# Patient Record
Sex: Female | Born: 2000 | Race: White | Hispanic: No | Marital: Single | Smoking: Never smoker
Health system: Southern US, Community
[De-identification: ages and names within clinical notes are randomized; demographics above are authoritative.]

## PROBLEM LIST (undated history)

## (undated) DIAGNOSIS — D6869 Other thrombophilia: Secondary | ICD-10-CM

## (undated) DIAGNOSIS — N911 Secondary amenorrhea: Secondary | ICD-10-CM

## (undated) DIAGNOSIS — E782 Mixed hyperlipidemia: Secondary | ICD-10-CM

## (undated) DIAGNOSIS — K219 Gastro-esophageal reflux disease without esophagitis: Secondary | ICD-10-CM

## (undated) DIAGNOSIS — E079 Disorder of thyroid, unspecified: Secondary | ICD-10-CM

## (undated) HISTORY — DX: Mixed hyperlipidemia: E78.2

## (undated) HISTORY — PX: TONSILECTOMY, ADENOIDECTOMY, BILATERAL MYRINGOTOMY AND TUBES: SHX2538

## (undated) HISTORY — DX: Other thrombophilia: D68.69

## (undated) HISTORY — DX: Disorder of thyroid, unspecified: E07.9

## (undated) HISTORY — DX: Secondary amenorrhea: N91.1

## (undated) HISTORY — DX: Gastro-esophageal reflux disease without esophagitis: K21.9

---
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
## 2019-05-05 ENCOUNTER — Ambulatory Visit (INDEPENDENT_AMBULATORY_CARE_PROVIDER_SITE_OTHER): Payer: No Typology Code available for payment source | Admitting: Legal Medicine

## 2019-05-05 ENCOUNTER — Encounter: Payer: Self-pay | Admitting: Legal Medicine

## 2019-05-05 ENCOUNTER — Other Ambulatory Visit: Payer: Self-pay

## 2019-05-05 VITALS — BP 110/80 | HR 78 | Temp 97.6°F | Resp 17 | Ht 65.0 in | Wt 259.4 lb

## 2019-05-05 DIAGNOSIS — N978 Female infertility of other origin: Secondary | ICD-10-CM | POA: Diagnosis not present

## 2019-05-05 DIAGNOSIS — J301 Allergic rhinitis due to pollen: Secondary | ICD-10-CM | POA: Diagnosis not present

## 2019-05-05 DIAGNOSIS — E039 Hypothyroidism, unspecified: Secondary | ICD-10-CM | POA: Diagnosis not present

## 2019-05-05 DIAGNOSIS — E782 Mixed hyperlipidemia: Secondary | ICD-10-CM | POA: Diagnosis not present

## 2019-05-05 DIAGNOSIS — K2100 Gastro-esophageal reflux disease with esophagitis, without bleeding: Secondary | ICD-10-CM

## 2019-05-05 DIAGNOSIS — G43C0 Periodic headache syndromes in child or adult, not intractable: Secondary | ICD-10-CM

## 2019-05-05 MED ORDER — FEXOFENADINE HCL 180 MG PO TABS: 180.0000 mg | ORAL_TABLET | Freq: Every day | ORAL | 3 refills | Status: DC

## 2019-05-05 MED ORDER — LEVOTHYROXINE SODIUM 50 MCG PO TABS: 50.0000 ug | ORAL_TABLET | Freq: Every day | ORAL | 6 refills | Status: DC

## 2019-05-05 MED ORDER — MEDROXYPROGESTERONE ACETATE 5 MG PO TABS: 5.0000 mg | ORAL_TABLET | Freq: Every day | ORAL | 6 refills | Status: DC

## 2019-05-05 MED ORDER — OMEPRAZOLE 20 MG PO CPDR: 20.0000 mg | DELAYED_RELEASE_CAPSULE | Freq: Two times a day (BID) | ORAL | 6 refills | Status: AC

## 2019-05-05 MED ORDER — IBUPROFEN 800 MG PO TABS: 800.0000 mg | ORAL_TABLET | Freq: Three times a day (TID) | ORAL | 6 refills | Status: DC | PRN

## 2019-05-05 NOTE — Assessment & Plan Note (Signed)
AN INDIVIDUAL CARE PLAN was established and reinforced today.  The patient's status was assessed using clinical findings on exam, lab and other diagnostic tests. The patient's disease status was assessed based on evidence-based guidelines and found to be good lipid controlled. MEDICATIONS were reviewed. SELF MANAGEMENT GOALS have been discussed and patient's success at attaining the goal of low cholesterol was assessed. RECOMMENDATION given include regular exercise 3 days a week and low cholesterol/low fat diet. CLINICAL SUMMARY including written plan to identify barriers unique to the patient due to social or economic  reasons was discussed.

## 2019-05-05 NOTE — Assessment & Plan Note (Signed)
We discussed allergies and she is to take her allergy medicine regularly.

## 2019-05-05 NOTE — Progress Notes (Signed)
Established Patient Office Visit  Subjective:  Patient ID: Kelli Gibson, female    DOB: 12-08-2000  Age: 19 y.o. MRN: [id number]  CC:  Chief Complaint  Patient presents with  . Hypothyroidism  . Gastroesophageal Reflux    HPI Kelli Gibson presents for Chronic visit  Patient has HYPOTHYROIDISM.  Diagnosed 6 years ago.  Patient has stable thyroid readings.  Patient is having no symptoms.  Last TSH was 3.8.  continue dosage of thyroid medicine.  Patient has gastroesophageal reflux symptoms withesophagitis and LTRD.  The symptoms are mid intensity.  Length of symptoms 6 years.  Medicines include omeprazole.  Complications include none  Patient presents with hyperlipidemia.  Compliance with treatment has been good; patient takes medicines as directed, maintains low cholesterol diet, follows up as directed, and maintains exercise regimen.  Patient is using diet without problems..  Past Medical History:  Diagnosis Date  . GERD (gastroesophageal reflux disease)   . Hyperlipemia, mixed   . Other thrombophilia ([place])   . Secondary amenorrhea   . Thyroid disease     Past Surgical History:  Procedure Laterality Date  . TONSILECTOMY, ADENOIDECTOMY, BILATERAL MYRINGOTOMY AND TUBES  4  . TYMPANOPLASTY Left 2014    Family History  Problem Relation Age of Onset  . Hypothyroidism Mother   . Hyperlipidemia Mother   . Thrombosis Mother   . Hypertension Father     Social History   Socioeconomic History  . Marital status: Single    Spouse name: Not on file  . Number of children: 0  . Years of education: Not on file  . Highest education level: High school graduate  Occupational History  . Occupation: kitchen at [employer]  Tobacco Use  . Smoking status: Never Smoker  . Smokeless tobacco: Never Used  Substance and Sexual Activity  . Alcohol use: Not Currently    Alcohol/week: 0.0 standard drinks  . Drug use: Never  . Sexual activity: Not Currently  Other Topics  Concern  . Not on file  Social History Narrative  . Not on file   Social Determinants of Health   Financial Resource Strain:   . Difficulty of Paying Living Expenses:   Food Insecurity:   . Worried About Charity fundraiser in the Last Year:   . Arboriculturist in the Last Year:   Transportation Needs:   . Film/video editor (Medical):   Marland Kitchen Lack of Transportation (Non-Medical):   Physical Activity:   . Days of Exercise per Week:   . Minutes of Exercise per Session:   Stress:   . Feeling of Stress :   Social Connections:   . Frequency of Communication with Friends and Family:   . Frequency of Social Gatherings with Friends and Family:   . Attends Religious Services:   . Active Member of Clubs or Organizations:   . Attends Archivist Meetings:   Marland Kitchen Marital Status:   Intimate Partner Violence:   . Fear of Current or Ex-Partner:   . Emotionally Abused:   Marland Kitchen Physically Abused:   . Sexually Abused:     Outpatient Medications Prior to Visit  Medication Sig Dispense Refill  . mometasone (NASONEX) 50 MCG/ACT nasal spray Place into the nose.    . Multiple Vitamin (MULTIVITAMIN) capsule Take by mouth.    . fexofenadine (ALLEGRA) 180 MG tablet Take 180 mg by mouth daily.    . fluticasone (FLONASE) 50 MCG/ACT nasal spray Place into the nose.    Marland Kitchen  ibuprofen (ADVIL) 800 MG tablet Take 800 mg by mouth every 8 (eight) hours as needed.    Marland Kitchen levothyroxine (SYNTHROID) 50 MCG tablet Take by mouth.    . medroxyPROGESTERone (PROVERA) 5 MG tablet Take by mouth.    . Omega-3 Fatty Acids (FISH OIL) 1000 MG CAPS Take by mouth.    Marland Kitchen omeprazole (PRILOSEC) 20 MG capsule Take 20 mg by mouth 2 (two) times daily before a meal.    . montelukast (SINGULAIR) 10 MG tablet     . loratadine (CLARITIN) 10 MG tablet     . propranolol (INDERAL) 20 MG tablet Take by mouth.     No facility-administered medications prior to visit.    No Known Allergies  ROS Review of Systems  Constitutional:  Negative.   HENT: Negative.   Eyes: Negative.        Glasses  Respiratory: Negative.   Cardiovascular: Negative.   Gastrointestinal: Negative.   Endocrine: Negative.   Genitourinary: Negative.   Musculoskeletal: Negative.   Neurological: Negative.       Objective:    Physical Exam  Constitutional: She is oriented to person, place, and time. She appears well-developed and well-nourished.  HENT:  Head: Atraumatic.  Eyes: Pupils are equal, round, and reactive to light. Conjunctivae and EOM are normal.  Cardiovascular: Normal rate, regular rhythm and normal heart sounds.  Pulmonary/Chest: Effort normal.  Abdominal: Soft. Bowel sounds are normal.  Musculoskeletal:        General: Normal range of motion.     Cervical back: Normal range of motion and neck supple.  Neurological: She is alert and oriented to person, place, and time. She has normal reflexes.  Skin: Skin is warm.  Psychiatric: She has a normal mood and affect.  Vitals reviewed.   BP 110/80 (BP Location: Right Arm, Patient Position: Sitting)   Pulse 78   Temp 97.6 F (36.4 C) (Temporal)   Resp 17   Ht 5' 5"  (1.651 m)   Wt 259 lb 6.4 oz (117.7 kg)   BMI 43.17 kg/m  Wt Readings from Last 3 Encounters:  05/05/19 259 lb 6.4 oz (117.7 kg) (>99 %, Z= 2.57)*   * Growth percentiles are based on CDC (Girls, 2-20 Years) data.     Health Maintenance Due  Topic Date Due  . HIV Screening  Never done  . INFLUENZA VACCINE  Never done  . TETANUS/TDAP  Never done    There are no preventive care reminders to display for this patient.  No results found for: TSH No results found for: WBC, HGB, HCT, MCV, PLT No results found for: NA, K, CHLORIDE, CO2, GLUCOSE, BUN, CREATININE, BILITOT, ALKPHOS, AST, ALT, PROT, ALBUMIN, CALCIUM, ANIONGAP, EGFR, GFR No results found for: CHOL No results found for: HDL No results found for: LDLCALC No results found for: TRIG No results found for: CHOLHDL No results found for: HGBA1C     Assessment & Plan:   Problem List Items Addressed This Visit      Respiratory   Allergic rhinitis - Primary    We discussed allergies and she is to take her allergy medicine regularly.      Relevant Medications   fexofenadine (ALLEGRA) 180 MG tablet     Endocrine   Adult hypothyroidism    AN INDIVIDUAL CARE PLAN was established and reinforced today.  The patient's status was assessed using clinical findings on exam, labs, and other diagnostic testing. Patient's success at meeting treatment goals based on disease specific evidence-bassed guidelines  and found to be in good control. RECOMMENDATIONS include maintaining present medicines and treatment.      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  She is doing well with no intermittent bleeding.      Relevant Medications   medroxyPROGESTERone (PROVERA) 5 MG tablet     Other   Mixed hyperlipidemia    AN INDIVIDUAL CARE PLAN was established and reinforced today.  The patient's status was assessed using clinical findings on exam, lab and other diagnostic tests. The patient's disease status was assessed based on evidence-based guidelines and found to be good lipid controlled. MEDICATIONS were reviewed. SELF MANAGEMENT GOALS have been discussed and patient's success at attaining the goal of low cholesterol was assessed. RECOMMENDATION given include regular exercise 3 days a week and low cholesterol/low fat diet. CLINICAL SUMMARY including written plan to identify barriers unique to the patient due to social or economic  reasons was discussed.      Relevant Orders   Lipid Panel    Other Visit Diagnoses    Gastroesophageal reflux disease with esophagitis without hemorrhage       Relevant Medications   omeprazole (PRILOSEC) 20 MG capsule    Periodic headache syndrome, not intractable       Relevant Medications   ibuprofen (ADVIL) 800 MG tablet      Meds ordered this encounter  Medications  . fexofenadine (ALLEGRA) 180 MG tablet    Sig: Take 1 tablet (180 mg total) by mouth daily.    Dispense:  30 tablet    Refill:  3  . medroxyPROGESTERone (PROVERA) 5 MG tablet    Sig: Take 1 tablet (5 mg total) by mouth daily.    Dispense:  30 tablet    Refill:  6  . omeprazole (PRILOSEC) 20 MG capsule    Sig: Take 1 capsule (20 mg total) by mouth 2 (two) times daily before a meal.    Dispense:  60 capsule    Refill:  6  . levothyroxine (SYNTHROID) 50 MCG tablet    Sig: Take 1 tablet (50 mcg total) by mouth daily before breakfast.    Dispense:  30 tablet    Refill:  6  . ibuprofen (ADVIL) 800 MG tablet    Sig: Take 1 tablet (800 mg total) by mouth every 8 (eight) hours as needed for headache.    Dispense:  30 tablet    Refill:  6    Follow-up: Return in about 6 months (around 11/05/2019) for fasting.    Reinaldo Meeker, MD

## 2019-05-05 NOTE — Patient Instructions (Signed)

## 2019-05-05 NOTE — Assessment & Plan Note (Signed)
Continue on provera that GYN started.  She is doing well with no intermittent bleeding.

## 2019-05-05 NOTE — Assessment & Plan Note (Signed)
AN INDIVIDUAL CARE PLAN was established and reinforced today.  The patient's status was assessed using clinical findings on exam, labs, and other diagnostic testing. Patient's success at meeting treatment goals based on disease specific evidence-bassed guidelines and found to be in good control. RECOMMENDATIONS include maintaining present medicines and treatment. 

## 2019-05-06 LAB — COMPREHENSIVE METABOLIC PANEL
ALT: 10 IU/L (ref 0–32)
AST: 12 IU/L (ref 0–40)
Albumin/Globulin Ratio: 1.7 (ref 1.2–2.2)
Albumin: 4.3 g/dL (ref 3.9–5.0)
Alkaline Phosphatase: 101 IU/L (ref 39–117)
BUN/Creatinine Ratio: 19 (ref 9–23)
BUN: 13 mg/dL (ref 6–20)
Bilirubin Total: <0.2 mg/dL (ref 0.0–1.2)
CO2: 20 mmol/L (ref 20–29)
Calcium: 9.4 mg/dL (ref 8.7–10.2)
Chloride: 104 mmol/L (ref 96–106)
Creatinine, Ser: 0.7 mg/dL (ref 0.57–1.00)
GFR calc Af Amer: 145 mL/min/{1.73_m2} (ref >59)
GFR calc non Af Amer: 126 mL/min/{1.73_m2} (ref >59)
Globulin, Total: 2.5 g/dL (ref 1.5–4.5)
Glucose: 81 mg/dL (ref 65–99)
Potassium: 4.7 mmol/L (ref 3.5–5.2)
Sodium: 140 mmol/L (ref 134–144)
Total Protein: 6.8 g/dL (ref 6.0–8.5)

## 2019-05-06 LAB — CBC WITH DIFFERENTIAL/PLATELET
Basophils Absolute: 0 10*3/uL (ref 0.0–0.2)
Basos: 0 %
EOS (ABSOLUTE): 0.2 10*3/uL (ref 0.0–0.4)
Eos: 2 %
Hematocrit: 39.7 % (ref 34.0–46.6)
Hemoglobin: 12.4 g/dL (ref 11.1–15.9)
Immature Grans (Abs): 0 10*3/uL (ref 0.0–0.1)
Immature Granulocytes: 0 %
Lymphocytes Absolute: 2.5 10*3/uL (ref 0.7–3.1)
Lymphs: 28 %
MCH: 24.9 pg — ABNORMAL LOW (ref 26.6–33.0)
MCHC: 31.2 g/dL — ABNORMAL LOW (ref 31.5–35.7)
MCV: 80 fL (ref 79–97)
Monocytes Absolute: 0.6 10*3/uL (ref 0.1–0.9)
Monocytes: 7 %
Neutrophils Absolute: 5.6 10*3/uL (ref 1.4–7.0)
Neutrophils: 63 %
Platelets: 421 10*3/uL (ref 150–450)
RBC: 4.98 x10E6/uL (ref 3.77–5.28)
RDW: 14.9 % (ref 11.7–15.4)
WBC: 9 10*3/uL (ref 3.4–10.8)

## 2019-05-06 LAB — LIPID PANEL
Chol/HDL Ratio: 4.8 ratio — ABNORMAL HIGH (ref 0.0–4.4)
Cholesterol, Total: 205 mg/dL — ABNORMAL HIGH (ref 100–169)
HDL: 43 mg/dL (ref >39)
LDL Chol Calc (NIH): 137 mg/dL — ABNORMAL HIGH (ref 0–109)
Triglycerides: 140 mg/dL — ABNORMAL HIGH (ref 0–89)
VLDL Cholesterol Cal: 25 mg/dL (ref 5–40)

## 2019-05-06 LAB — TSH: TSH: 6.6 u[IU]/mL — ABNORMAL HIGH (ref 0.450–4.500)

## 2019-05-06 LAB — CARDIOVASCULAR RISK ASSESSMENT

## 2019-05-07 ENCOUNTER — Other Ambulatory Visit: Payer: Self-pay | Admitting: Legal Medicine

## 2019-05-07 DIAGNOSIS — E039 Hypothyroidism, unspecified: Secondary | ICD-10-CM

## 2019-05-07 MED ORDER — LEVOTHYROXINE SODIUM 75 MCG PO TABS: 75.0000 ug | ORAL_TABLET | Freq: Every day | ORAL | 1 refills | Status: DC

## 2019-05-07 NOTE — Progress Notes (Signed)
Cbc normal. Kidney and liver tests normal, Cholesterol is vey high LDL 137 need to start low cholesterol diet, TSH 6.60- need to increase thyroid to a day. lp

## 2019-05-09 ENCOUNTER — Other Ambulatory Visit: Payer: Self-pay | Admitting: Legal Medicine

## 2019-05-09 DIAGNOSIS — E782 Mixed hyperlipidemia: Secondary | ICD-10-CM

## 2019-05-09 MED ORDER — PRAVASTATIN SODIUM 40 MG PO TABS: 40.0000 mg | ORAL_TABLET | Freq: Every day | ORAL | 1 refills | Status: DC

## 2019-07-29 ENCOUNTER — Encounter: Payer: Self-pay | Admitting: Legal Medicine

## 2019-07-29 ENCOUNTER — Ambulatory Visit (INDEPENDENT_AMBULATORY_CARE_PROVIDER_SITE_OTHER): Payer: Self-pay | Admitting: Legal Medicine

## 2019-07-29 ENCOUNTER — Other Ambulatory Visit: Payer: Self-pay

## 2019-07-29 VITALS — BP 120/84 | HR 91 | Temp 98.0°F | Resp 16 | Ht 65.0 in | Wt 258.0 lb

## 2019-07-29 DIAGNOSIS — S39012A Strain of muscle, fascia and tendon of lower back, initial encounter: Secondary | ICD-10-CM

## 2019-07-29 MED ORDER — CYCLOBENZAPRINE HCL 10 MG PO TABS: 10.0000 mg | ORAL_TABLET | Freq: Three times a day (TID) | ORAL | 0 refills | Status: AC | PRN

## 2019-07-29 MED ORDER — TRAMADOL HCL 50 MG PO TABS: 50.0000 mg | ORAL_TABLET | Freq: Three times a day (TID) | ORAL | 0 refills | Status: AC | PRN

## 2019-07-29 NOTE — Progress Notes (Signed)
Established Patient Office Visit  Subjective:  Patient ID: Kelli Gibson, female    DOB: Jun 10, 2000  Age: 19 y.o. MRN: [id number]  CC:  Chief Complaint  Patient presents with  . Back Pain    HPI MAME TWOMBLY presents for low back pain for 3 weeks, getting- lifting left leg.   Better Standing still.  Worse pain lying down.  Past Medical History:  Diagnosis Date  . GERD (gastroesophageal reflux disease)   . Hyperlipemia, mixed   . Other thrombophilia ([place])   . Secondary amenorrhea   . Thyroid disease     Past Surgical History:  Procedure Laterality Date  . TONSILECTOMY, ADENOIDECTOMY, BILATERAL MYRINGOTOMY AND TUBES  4  . TYMPANOPLASTY Left 2014    Family History  Problem Relation Age of Onset  . Hypothyroidism Mother   . Hyperlipidemia Mother   . Thrombosis Mother   . Hypertension Father     Social History   Socioeconomic History  . Marital status: Single    Spouse name: Not on file  . Number of children: 0  . Years of education: Not on file  . Highest education level: High school graduate  Occupational History  . Occupation: kitchen at [employer]  Tobacco Use  . Smoking status: Never Smoker  . Smokeless tobacco: Never Used  Substance and Sexual Activity  . Alcohol use: Not Currently    Alcohol/week: 0.0 standard drinks  . Drug use: Never  . Sexual activity: Not Currently  Other Topics Concern  . Not on file  Social History Narrative  . Not on file   Social Determinants of Health   Financial Resource Strain:   . Difficulty of Paying Living Expenses:   Food Insecurity:   . Worried About Charity fundraiser in the Last Year:   . Arboriculturist in the Last Year:   Transportation Needs:   . Film/video editor (Medical):   Marland Kitchen Lack of Transportation (Non-Medical):   Physical Activity:   . Days of Exercise per Week:   . Minutes of Exercise per Session:   Stress:   . Feeling of Stress :   Social Connections:   . Frequency of  Communication with Friends and Family:   . Frequency of Social Gatherings with Friends and Family:   . Attends Religious Services:   . Active Member of Clubs or Organizations:   . Attends Archivist Meetings:   Marland Kitchen Marital Status:   Intimate Partner Violence:   . Fear of Current or Ex-Partner:   . Emotionally Abused:   Marland Kitchen Physically Abused:   . Sexually Abused:     Outpatient Medications Prior to Visit  Medication Sig Dispense Refill  . fexofenadine (ALLEGRA) 180 MG tablet Take 1 tablet (180 mg total) by mouth daily. 30 tablet 3  . ibuprofen (ADVIL) 800 MG tablet Take 1 tablet (800 mg total) by mouth every 8 (eight) hours as needed for headache. 30 tablet 6  . levothyroxine (SYNTHROID) 75 MCG tablet Take 1 tablet (75 mcg total) by mouth daily. 90 tablet 1  . medroxyPROGESTERone (PROVERA) 5 MG tablet Take 1 tablet (5 mg total) by mouth daily. 30 tablet 6  . mometasone (NASONEX) 50 MCG/ACT nasal spray Place into the nose.    . montelukast (SINGULAIR) 10 MG tablet     . Multiple Vitamin (MULTIVITAMIN) capsule Take by mouth.    Marland Kitchen omeprazole (PRILOSEC) 20 MG capsule Take 1 capsule (20 mg total) by mouth 2 (  two) times daily before a meal. 60 capsule 6  . pravastatin (PRAVACHOL) 40 MG tablet Take 1 tablet (40 mg total) by mouth daily. 90 tablet 1   No facility-administered medications prior to visit.    Allergies  Allergen Reactions  . Other Other (See Comments)    Tree nuts    ROS Review of Systems  Constitutional: Negative.   HENT: Negative.   Cardiovascular: Negative.   Gastrointestinal: Negative.   Genitourinary: Negative.   Musculoskeletal: Positive for back pain.  Skin: Negative.   Psychiatric/Behavioral: Negative.       Objective:    Physical Exam  Constitutional: She appears well-developed and well-nourished.  Cardiovascular: Normal rate, regular rhythm, normal heart sounds and intact distal pulses.  Pulmonary/Chest: Effort normal and breath sounds normal.    Musculoskeletal:     Lumbar back: Pain, spasms and tenderness present.       Back:     Comments: Flexion 20 degrees , extension 10 degrees with pain, Negative SLR bilaterally, no motor or sensory deficits in LE.  Vitals reviewed.   BP 120/84   Pulse 91   Temp 98 F (36.7 C)   Resp 16   Ht 5\' 5"  (1.651 m)   Wt 258 lb (117 kg)   SpO2 96%   BMI 42.93 kg/m  Wt Readings from Last 3 Encounters:  07/29/19 258 lb (117 kg) (>99 %, Z= 2.57)*  05/05/19 259 lb 6.4 oz (117.7 kg) (>99 %, Z= 2.57)*   * Growth percentiles are based on CDC (Girls, 2-20 Years) data.     Health Maintenance Due  Topic Date Due  . COVID-19 Vaccine (1) Never done  . HIV Screening  Never done  . TETANUS/TDAP  Never done    There are no preventive care reminders to display for this patient.  Lab Results  Component Value Date   TSH 6.600 (H) 05/05/2019   Lab Results  Component Value Date   WBC 9.0 05/05/2019   HGB 12.4 05/05/2019   HCT 39.7 05/05/2019   MCV 80 05/05/2019   PLT 421 05/05/2019   Lab Results  Component Value Date   NA 140 05/05/2019   K 4.7 05/05/2019   CO2 20 05/05/2019   GLUCOSE 81 05/05/2019   BUN 13 05/05/2019   CREATININE 0.70 05/05/2019   BILITOT <0.2 05/05/2019   ALKPHOS 101 05/05/2019   AST 12 05/05/2019   ALT 10 05/05/2019   PROT 6.8 05/05/2019   ALBUMIN 4.3 05/05/2019   CALCIUM 9.4 05/05/2019   Lab Results  Component Value Date   CHOL 205 (H) 05/05/2019   Lab Results  Component Value Date   HDL 43 05/05/2019   Lab Results  Component Value Date   LDLCALC 137 (H) 05/05/2019   Lab Results  Component Value Date   TRIG 140 (H) 05/05/2019   Lab Results  Component Value Date   CHOLHDL 4.8 (H) 05/05/2019   No results found for: HGBA1C    Assessment & Plan:   Lumbar back sprain: We discussed treatment.  Add flexeril and tramadol for pain.  Home exercises given  Note for work this weekend.  Meds ordered this encounter  Medications  . cyclobenzaprine  (FLEXERIL) 10 MG tablet    Sig: Take 1 tablet (10 mg total) by mouth 3 (three) times daily as needed for muscle spasms.    Dispense:  30 tablet    Refill:  0  . traMADol (ULTRAM) 50 MG tablet    Sig: Take 1  tablet (50 mg total) by mouth every 8 (eight) hours as needed for up to 10 days.    Dispense:  30 tablet    Refill:  0    Follow-up: Return in about 2 weeks (around 08/12/2019) for for back.    Brent Bulla, MD

## 2019-07-29 NOTE — Patient Instructions (Signed)

## 2019-08-10 ENCOUNTER — Encounter: Payer: Self-pay | Admitting: Legal Medicine

## 2019-08-10 ENCOUNTER — Other Ambulatory Visit: Payer: Self-pay

## 2019-08-10 ENCOUNTER — Ambulatory Visit (INDEPENDENT_AMBULATORY_CARE_PROVIDER_SITE_OTHER): Payer: Self-pay | Admitting: Legal Medicine

## 2019-08-10 VITALS — BP 110/72 | HR 87 | Temp 97.6°F | Resp 16 | Ht 65.0 in | Wt 256.0 lb

## 2019-08-10 DIAGNOSIS — S39012D Strain of muscle, fascia and tendon of lower back, subsequent encounter: Secondary | ICD-10-CM | POA: Insufficient documentation

## 2019-08-10 DIAGNOSIS — G43C0 Periodic headache syndromes in child or adult, not intractable: Secondary | ICD-10-CM

## 2019-08-10 MED ORDER — IBUPROFEN 800 MG PO TABS: 800.0000 mg | ORAL_TABLET | Freq: Three times a day (TID) | ORAL | 6 refills | Status: DC | PRN

## 2019-08-10 NOTE — Progress Notes (Signed)
Established Patient Office Visit  Subjective:  Patient ID: Kelli Gibson, female    DOB: Oct 16, 2000  Age: 19 y.o. MRN: [id number]  CC:  Chief Complaint  Patient presents with  . Back Pain    HPI Kelli Gibson presents for lumbar strain.  Still sore, no radiation pain.  Hurts to lift and extend spine.  She has been working  , living [employer]. No red flags.  Past Medical History:  Diagnosis Date  . GERD (gastroesophageal reflux disease)   . Hyperlipemia, mixed   . Other thrombophilia ([facility])   . Secondary amenorrhea   . Thyroid disease     Past Surgical History:  Procedure Laterality Date  . TONSILECTOMY, ADENOIDECTOMY, BILATERAL MYRINGOTOMY AND TUBES  4  . TYMPANOPLASTY Left 2014    Family History  Problem Relation Age of Onset  . Hypothyroidism Mother   . Hyperlipidemia Mother   . Thrombosis Mother   . Hypertension Father     Social History   Socioeconomic History  . Marital status: Single    Spouse name: Not on file  . Number of children: 0  . Years of education: Not on file  . Highest education level: High school graduate  Occupational History  . Occupation: kitchen at [employer]  Tobacco Use  . Smoking status: Never Smoker  . Smokeless tobacco: Never Used  Substance and Sexual Activity  . Alcohol use: Not Currently    Alcohol/week: 0.0 standard drinks  . Drug use: Never  . Sexual activity: Not Currently  Other Topics Concern  . Not on file  Social History Narrative  . Not on file   Social Determinants of Health   Financial Resource Strain:   . Difficulty of Paying Living Expenses:   Food Insecurity:   . Worried About Programme researcher, broadcasting/film/video in the Last Year:   . Barista in the Last Year:   Transportation Needs:   . Freight forwarder (Medical):   Marland Kitchen Lack of Transportation (Non-Medical):   Physical Activity:   . Days of Exercise per Week:   . Minutes of Exercise per Session:   Stress:   . Feeling of Stress :   Social  Connections:   . Frequency of Communication with Friends and Family:   . Frequency of Social Gatherings with Friends and Family:   . Attends Religious Services:   . Active Member of Clubs or Organizations:   . Attends Banker Meetings:   Marland Kitchen Marital Status:   Intimate Partner Violence:   . Fear of Current or Ex-Partner:   . Emotionally Abused:   Marland Kitchen Physically Abused:   . Sexually Abused:     Outpatient Medications Prior to Visit  Medication Sig Dispense Refill  . cyclobenzaprine (FLEXERIL) 10 MG tablet Take 1 tablet (10 mg total) by mouth 3 (three) times daily as needed for muscle spasms. 30 tablet 0  . fexofenadine (ALLEGRA) 180 MG tablet Take 1 tablet (180 mg total) by mouth daily. 30 tablet 3  . levothyroxine (SYNTHROID) 75 MCG tablet Take 1 tablet (75 mcg total) by mouth daily. 90 tablet 1  . medroxyPROGESTERone (PROVERA) 5 MG tablet Take 1 tablet (5 mg total) by mouth daily. 30 tablet 6  . mometasone (NASONEX) 50 MCG/ACT nasal spray Place into the nose.    . montelukast (SINGULAIR) 10 MG tablet     . Multiple Vitamin (MULTIVITAMIN) capsule Take by mouth.    Marland Kitchen omeprazole (PRILOSEC) 20 MG capsule  Take 1 capsule (20 mg total) by mouth 2 (two) times daily before a meal. 60 capsule 6  . pravastatin (PRAVACHOL) 40 MG tablet Take 1 tablet (40 mg total) by mouth daily. 90 tablet 1  . ibuprofen (ADVIL) 800 MG tablet Take 1 tablet (800 mg total) by mouth every 8 (eight) hours as needed for headache. 30 tablet 6   No facility-administered medications prior to visit.    Allergies  Allergen Reactions  . Other Other (See Comments)    Tree nuts    ROS Review of Systems  Constitutional: Negative.   HENT: Negative.   Eyes: Negative.   Respiratory: Negative.   Cardiovascular: Negative.   Gastrointestinal: Negative.   Genitourinary: Negative.   Musculoskeletal: Positive for back pain.      Objective:    Physical Exam Vitals reviewed.  Constitutional:      Appearance:  Normal appearance.  HENT:     Head: Normocephalic and atraumatic.  Cardiovascular:     Rate and Rhythm: Regular rhythm.     Pulses: Normal pulses.     Heart sounds: Normal heart sounds.  Pulmonary:     Effort: Pulmonary effort is normal.     Breath sounds: Normal breath sounds.  Musculoskeletal:     Lumbar back: Tenderness and bony tenderness present. Negative right straight leg raise test and negative left straight leg raise test.       Back:     Comments: Flex lumbar spine 20 degrees, extend 0 degrees,  rotation 30 degrees.  Neurological:     Mental Status: She is alert.     BP 110/72   Pulse 87   Temp 97.6 F (36.4 C)   Resp 16   Ht 5\' 5"  (1.651 m)   Wt 256 lb (116.1 kg)   SpO2 97%   BMI 42.60 kg/m  Wt Readings from Last 3 Encounters:  08/10/19 256 lb (116.1 kg) (>99 %, Z= 2.56)*  07/29/19 258 lb (117 kg) (>99 %, Z= 2.57)*  05/05/19 259 lb 6.4 oz (117.7 kg) (>99 %, Z= 2.57)*   * Growth percentiles are based on CDC (Girls, 2-20 Years) data.     Health Maintenance Due  Topic Date Due  . Hepatitis C Screening  Never done  . COVID-19 Vaccine (1) Never done  . HIV Screening  Never done  . TETANUS/TDAP  Never done    There are no preventive care reminders to display for this patient.  Lab Results  Component Value Date   TSH 6.600 (H) 05/05/2019   Lab Results  Component Value Date   WBC 9.0 05/05/2019   HGB 12.4 05/05/2019   HCT 39.7 05/05/2019   MCV 80 05/05/2019   PLT 421 05/05/2019   Lab Results  Component Value Date   NA 140 05/05/2019   K 4.7 05/05/2019   CO2 20 05/05/2019   GLUCOSE 81 05/05/2019   BUN 13 05/05/2019   CREATININE 0.70 05/05/2019   BILITOT <0.2 05/05/2019   ALKPHOS 101 05/05/2019   AST 12 05/05/2019   ALT 10 05/05/2019   PROT 6.8 05/05/2019   ALBUMIN 4.3 05/05/2019   CALCIUM 9.4 05/05/2019   Lab Results  Component Value Date   CHOL 205 (H) 05/05/2019   Lab Results  Component Value Date   HDL 43 05/05/2019   Lab  Results  Component Value Date   LDLCALC 137 (H) 05/05/2019   Lab Results  Component Value Date   TRIG 140 (H) 05/05/2019   Lab Results  Component Value Date   CHOLHDL 4.8 (H) 05/05/2019   No results found for: HGBA1C    Assessment & Plan:   Problem List Items Addressed This Visit      Musculoskeletal and Integument   Lumbar strain, subsequent encounter - Primary    The lumbar spine has improved but she needs to continue home PT, she is unable to afford Physical Therapy. She is going to start at [facility].  Continue ibuprofen. She is working.       Other Visit Diagnoses    Periodic headache syndrome, not intractable       Relevant Medications   ibuprofen (ADVIL) 800 MG tablet      Meds ordered this encounter  Medications  . ibuprofen (ADVIL) 800 MG tablet    Sig: Take 1 tablet (800 mg total) by mouth every 8 (eight) hours as needed for headache.    Dispense:  30 tablet    Refill:  6    Follow-up: Return in about 2 weeks (around 08/24/2019) for back.    Brent Bulla, MD

## 2019-08-10 NOTE — Assessment & Plan Note (Signed)
The lumbar spine has improved but she needs to continue home PT, she is unable to afford Physical Therapy. She is going to start at [facility].  Continue ibuprofen. She is working.

## 2019-08-24 ENCOUNTER — Ambulatory Visit: Payer: Self-pay | Admitting: Legal Medicine

## 2019-10-03 ENCOUNTER — Ambulatory Visit: Payer: Self-pay | Admitting: Legal Medicine

## 2019-10-11 ENCOUNTER — Other Ambulatory Visit: Payer: Self-pay | Admitting: Legal Medicine

## 2019-10-11 DIAGNOSIS — J301 Allergic rhinitis due to pollen: Secondary | ICD-10-CM

## 2019-10-27 DIAGNOSIS — Z20828 Contact with and (suspected) exposure to other viral communicable diseases: Secondary | ICD-10-CM | POA: Diagnosis not present

## 2019-10-27 DIAGNOSIS — J3489 Other specified disorders of nose and nasal sinuses: Secondary | ICD-10-CM | POA: Diagnosis not present

## 2020-01-10 DIAGNOSIS — F331 Major depressive disorder, recurrent, moderate: Secondary | ICD-10-CM | POA: Diagnosis not present

## 2020-01-10 DIAGNOSIS — F411 Generalized anxiety disorder: Secondary | ICD-10-CM | POA: Diagnosis not present

## 2020-02-15 ENCOUNTER — Other Ambulatory Visit: Payer: Self-pay | Admitting: Legal Medicine

## 2020-02-15 DIAGNOSIS — E039 Hypothyroidism, unspecified: Secondary | ICD-10-CM

## 2020-02-16 DIAGNOSIS — H52223 Regular astigmatism, bilateral: Secondary | ICD-10-CM | POA: Diagnosis not present

## 2020-02-21 ENCOUNTER — Encounter: Payer: Self-pay | Admitting: Legal Medicine

## 2020-02-21 ENCOUNTER — Other Ambulatory Visit: Payer: Self-pay

## 2020-02-21 ENCOUNTER — Ambulatory Visit: Payer: BC Managed Care – PPO | Admitting: Legal Medicine

## 2020-02-21 VITALS — BP 130/86 | HR 91 | Temp 97.3°F | Resp 16 | Ht 65.0 in | Wt 281.0 lb

## 2020-02-21 DIAGNOSIS — G43C0 Periodic headache syndromes in child or adult, not intractable: Secondary | ICD-10-CM | POA: Diagnosis not present

## 2020-02-21 DIAGNOSIS — N978 Female infertility of other origin: Secondary | ICD-10-CM

## 2020-02-21 DIAGNOSIS — E781 Pure hyperglyceridemia: Secondary | ICD-10-CM | POA: Diagnosis not present

## 2020-02-21 DIAGNOSIS — E039 Hypothyroidism, unspecified: Secondary | ICD-10-CM

## 2020-02-21 DIAGNOSIS — Z201 Contact with and (suspected) exposure to tuberculosis: Secondary | ICD-10-CM | POA: Diagnosis not present

## 2020-02-21 DIAGNOSIS — E782 Mixed hyperlipidemia: Secondary | ICD-10-CM

## 2020-02-21 DIAGNOSIS — J301 Allergic rhinitis due to pollen: Secondary | ICD-10-CM

## 2020-02-21 MED ORDER — FEXOFENADINE HCL 180 MG PO TABS: 180.0000 mg | ORAL_TABLET | Freq: Every day | ORAL | 2 refills | Status: DC

## 2020-02-21 MED ORDER — MONTELUKAST SODIUM 10 MG PO TABS
10.0000 mg | ORAL_TABLET | Freq: Every day | ORAL | 2 refills | Status: DC
Start: 2020-02-21 — End: 2021-01-28

## 2020-02-21 MED ORDER — PRAVASTATIN SODIUM 40 MG PO TABS: 40.0000 mg | ORAL_TABLET | Freq: Every day | ORAL | 2 refills | Status: DC

## 2020-02-21 MED ORDER — MEDROXYPROGESTERONE ACETATE 5 MG PO TABS: 5.0000 mg | ORAL_TABLET | Freq: Every day | ORAL | 6 refills | Status: DC

## 2020-02-21 MED ORDER — LEVOTHYROXINE SODIUM 75 MCG PO TABS: 75.0000 ug | ORAL_TABLET | Freq: Every day | ORAL | 2 refills | Status: DC

## 2020-02-21 MED ORDER — IBUPROFEN 800 MG PO TABS: 800.0000 mg | ORAL_TABLET | Freq: Three times a day (TID) | ORAL | 6 refills | Status: AC | PRN

## 2020-02-21 NOTE — Progress Notes (Signed)
Subjective:  Patient ID: Kelli Gibson, female    DOB: [birth year]  Age: 19 y.o. MRN: [id number]  Chief Complaint  Patient presents with  . Hypothyroidism  . Hyperlipidemia    HPI: chronic visit  Patient has HYPOTHYROIDISM.  Diagnosed 2 years ago.  Patient has stable thyroid readings.  Patient is having no symptoms.  Last TSH was normal.  continue dosage of thyroid medicine.  Patient presents with hyperlipidemia.  Compliance with treatment has been good; patient takes medicines as directed, maintains low cholesterol diet, follows up as directed, and maintains exercise regimen.  Patient is using pravastatin without problems.   Current Outpatient Medications on File Prior to Visit  Medication Sig Dispense Refill  . cyclobenzaprine (FLEXERIL) 10 MG tablet Take 1 tablet (10 mg total) by mouth 3 (three) times daily as needed for muscle spasms. 30 tablet 0  . mometasone (NASONEX) 50 MCG/ACT nasal spray Place into the nose.    . Multiple Vitamin (MULTIVITAMIN) capsule Take by mouth.    Marland Kitchen omeprazole (PRILOSEC) 20 MG capsule Take 1 capsule (20 mg total) by mouth 2 (two) times daily before a meal. 60 capsule 6   No current facility-administered medications on file prior to visit.   Past Medical History:  Diagnosis Date  . GERD (gastroesophageal reflux disease)   . Hyperlipemia, mixed   . Other thrombophilia ([facility])   . Secondary amenorrhea   . Thyroid disease    Past Surgical History:  Procedure Laterality Date  . TONSILECTOMY, ADENOIDECTOMY, BILATERAL MYRINGOTOMY AND TUBES  4  . TYMPANOPLASTY Left 2014    Family History  Problem Relation Age of Onset  . Hypothyroidism Mother   . Hyperlipidemia Mother   . Thrombosis Mother   . Hypertension Father    Social History   Socioeconomic History  . Marital status: Single    Spouse name: Not on file  . Number of children: 0  . Years of education: Not on file  . Highest education level: High school graduate  Occupational History  .  Occupation: kitchen at [employer]  Tobacco Use  . Smoking status: Never Smoker  . Smokeless tobacco: Never Used  Substance and Sexual Activity  . Alcohol use: Not Currently    Alcohol/week: 0.0 standard drinks  . Drug use: Never  . Sexual activity: Not Currently  Other Topics Concern  . Not on file  Social History Narrative  . Not on file   Social Determinants of Health   Financial Resource Strain: Not on file  Food Insecurity: Not on file  Transportation Needs: Not on file  Physical Activity: Not on file  Stress: Not on file  Social Connections: Not on file    Review of Systems  Constitutional: Negative for chills and fever.  HENT: Negative for congestion, rhinorrhea and sinus pain.   Eyes: Negative for visual disturbance.  Respiratory: Negative for cough, chest tightness and wheezing.   Cardiovascular: Negative for chest pain, palpitations and leg swelling.  Gastrointestinal: Positive for abdominal distention.  Endocrine: Negative for polyuria.  Genitourinary: Negative for difficulty urinating, dyspareunia and urgency.  Musculoskeletal: Negative for arthralgias.  Skin: Negative.   Neurological: Negative for dizziness, weakness and numbness.  Psychiatric/Behavioral: Negative.      Objective:  BP 130/86   Pulse 91   Temp (!) 97.3 F (36.3 C)   Resp 16   Ht 5\' 5"  (1.651 m)   Wt 281 lb (127.5 kg)   SpO2 97%   BMI 46.76 kg/m  BP/Weight 02/21/2020 08/10/2019 07/29/2019  Systolic BP 130 110 120  Diastolic BP 86 72 84  Wt. (Lbs) 281 256 258  BMI 46.76 42.6 42.93    Physical Exam Vitals reviewed.  Constitutional:      Appearance: Normal appearance.  HENT:     Right Ear: Tympanic membrane, ear canal and external ear normal.     Left Ear: Tympanic membrane, ear canal and external ear normal.     Mouth/Throat:     Mouth: Mucous membranes are moist.     Pharynx: Oropharynx is clear.  Eyes:     Extraocular Movements: Extraocular movements intact.      Conjunctiva/sclera: Conjunctivae normal.     Pupils: Pupils are equal, round, and reactive to light.  Cardiovascular:     Rate and Rhythm: Normal rate and regular rhythm.     Pulses: Normal pulses.     Heart sounds: Normal heart sounds. No murmur heard. No gallop.   Pulmonary:     Effort: Pulmonary effort is normal.     Breath sounds: Normal breath sounds. No wheezing.  Abdominal:     General: Abdomen is flat. Bowel sounds are normal. There is no distension.     Palpations: Abdomen is soft.     Tenderness: There is no abdominal tenderness.  Musculoskeletal:        General: No deformity. Normal range of motion.     Cervical back: Normal range of motion and neck supple.  Skin:    General: Skin is warm and dry.     Capillary Refill: Capillary refill takes less than 2 seconds.  Neurological:     General: No focal deficit present.     Mental Status: She is alert and oriented to person, place, and time.  Psychiatric:        Mood and Affect: Mood normal.        Thought Content: Thought content normal.       Lab Results  Component Value Date   WBC 9.0 05/05/2019   HGB 12.4 05/05/2019   HCT 39.7 05/05/2019   PLT 421 05/05/2019   GLUCOSE 81 05/05/2019   CHOL 205 (H) 05/05/2019   TRIG 140 (H) 05/05/2019   HDL 43 05/05/2019   LDLCALC 137 (H) 05/05/2019   ALT 10 05/05/2019   AST 12 05/05/2019   NA 140 05/05/2019   K 4.7 05/05/2019   CL 104 05/05/2019   CREATININE 0.70 05/05/2019   BUN 13 05/05/2019   CO2 20 05/05/2019   TSH 6.600 (H) 05/05/2019      Assessment & Plan:  Diagnoses and all orders for this visit: Mixed hyperlipidemia -     pravastatin (PRAVACHOL) 40 MG tablet; Take 1 tablet (40 mg total) by mouth daily. AN INDIVIDUAL CARE PLAN for hyperlipidemia/ cholesterol was established and reinforced today.  The patient's status was assessed using clinical findings on exam, lab and other diagnostic tests. The patient's disease status was assessed based on evidence-based  guidelines and found to be well controlled. MEDICATIONS were reviewed. SELF MANAGEMENT GOALS have been discussed and patient's success at attaining the goal of low cholesterol was assessed. RECOMMENDATION given include regular exercise 3 days a week and low cholesterol/low fat diet. CLINICAL SUMMARY including written plan to identify barriers unique to the patient due to social or economic  reasons was discussed.  Adult hypothyroidism -     levothyroxine (SYNTHROID) 75 MCG tablet; Take 1 tablet (75 mcg total) by mouth daily. -     CBC  with Differential/Platelet -     Comprehensive metabolic panel -     TSH Patient is known to have hypothyroidism and is n treatment with levothyroxine 75 mcg.  Patient was diagnosed 10 years ago.  Other treatment includes none.  Patient is compliant with medicines and last TSH 6 months ago.  Last TSH was normal . Hypertriglyceridemia -     CBC with Differential/Platelet -     Comprehensive metabolic panel -     Lipid panel AN INDIVIDUAL CARE PLAN for hyperlipidemia/ cholesterol was established and reinforced today.  The patient's status was assessed using clinical findings on exam, lab and other diagnostic tests. The patient's disease status was assessed based on evidence-based guidelines and found to be fair controlled. MEDICATIONS were reviewed. SELF MANAGEMENT GOALS have been discussed and patient's success at attaining the goal of low cholesterol was assessed. RECOMMENDATION given include regular exercise 3 days a week and low cholesterol/low fat diet. CLINICAL SUMMARY including written plan to identify barriers unique to the patient due to social or economic  reasons was discussed.  Periodic headache syndrome, not intractable -     ibuprofen (ADVIL) 800 MG tablet; Take 1 tablet (800 mg total) by mouth every 8 (eight) hours as needed for headache. 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 Patient has gluteal phase defect and infertility and was started on Provera 5 mg by her GYN we will continue this at the present time.  Seasonal allergic rhinitis due to pollen -     fexofenadine (ALLEGRA) 180 MG tablet; Take 1 tablet (180 mg total) by mouth daily. Patient has chronic allergies and uses Allegra with good effect also is on montelukast. Exposure to TB -     PPD Patient needs PPD for her will continue with school. Other orders -     montelukast (SINGULAIR) 10 MG tablet; Take 1 tablet (10 mg total) by mouth at bedtime.          I spent 20 minutes dedicated to the care of this patient on the date of this encounter to include face-to-face time with the patient, as well as:  Follow-up: Return in about 6 months (around 08/21/2020) for fasting.  An After Visit Summary was printed and given to the patient.  Brent Bulla, MD [facility] [phone]

## 2020-02-22 LAB — CBC WITH DIFFERENTIAL/PLATELET
Basophils Absolute: 0.1 10*3/uL (ref 0.0–0.2)
Basos: 1 %
EOS (ABSOLUTE): 0.2 10*3/uL (ref 0.0–0.4)
Eos: 2 %
Hematocrit: 36 % (ref 34.0–46.6)
Hemoglobin: 11.4 g/dL (ref 11.1–15.9)
Immature Grans (Abs): 0 10*3/uL (ref 0.0–0.1)
Immature Granulocytes: 0 %
Lymphocytes Absolute: 2.5 10*3/uL (ref 0.7–3.1)
Lymphs: 27 %
MCH: 23.2 pg — ABNORMAL LOW (ref 26.6–33.0)
MCHC: 31.7 g/dL (ref 31.5–35.7)
MCV: 73 fL — ABNORMAL LOW (ref 79–97)
Monocytes Absolute: 0.6 10*3/uL (ref 0.1–0.9)
Monocytes: 6 %
Neutrophils Absolute: 5.9 10*3/uL (ref 1.4–7.0)
Neutrophils: 64 %
Platelets: 419 10*3/uL (ref 150–450)
RBC: 4.92 x10E6/uL (ref 3.77–5.28)
RDW: 15.8 % — ABNORMAL HIGH (ref 11.7–15.4)
WBC: 9.3 10*3/uL (ref 3.4–10.8)

## 2020-02-22 LAB — COMPREHENSIVE METABOLIC PANEL
ALT: 15 IU/L (ref 0–32)
AST: 17 IU/L (ref 0–40)
Albumin/Globulin Ratio: 1.9 (ref 1.2–2.2)
Albumin: 4.5 g/dL (ref 3.9–5.0)
Alkaline Phosphatase: 108 IU/L — ABNORMAL HIGH (ref 42–106)
BUN/Creatinine Ratio: 13 (ref 9–23)
BUN: 10 mg/dL (ref 6–20)
Bilirubin Total: <0.2 mg/dL (ref 0.0–1.2)
CO2: 21 mmol/L (ref 20–29)
Calcium: 9.5 mg/dL (ref 8.7–10.2)
Chloride: 105 mmol/L (ref 96–106)
Creatinine, Ser: 0.77 mg/dL (ref 0.57–1.00)
GFR calc Af Amer: 129 mL/min/{1.73_m2} (ref >59)
GFR calc non Af Amer: 112 mL/min/{1.73_m2} (ref >59)
Globulin, Total: 2.4 g/dL (ref 1.5–4.5)
Glucose: 84 mg/dL (ref 65–99)
Potassium: 4.5 mmol/L (ref 3.5–5.2)
Sodium: 141 mmol/L (ref 134–144)
Total Protein: 6.9 g/dL (ref 6.0–8.5)

## 2020-02-22 LAB — TSH: TSH: 5.87 u[IU]/mL — ABNORMAL HIGH (ref 0.450–4.500)

## 2020-02-22 LAB — LIPID PANEL
Chol/HDL Ratio: 5 ratio — ABNORMAL HIGH (ref 0.0–4.4)
Cholesterol, Total: 206 mg/dL — ABNORMAL HIGH (ref 100–199)
HDL: 41 mg/dL (ref >39)
LDL Chol Calc (NIH): 146 mg/dL — ABNORMAL HIGH (ref 0–99)
Triglycerides: 107 mg/dL (ref 0–149)
VLDL Cholesterol Cal: 19 mg/dL (ref 5–40)

## 2020-02-22 LAB — CARDIOVASCULAR RISK ASSESSMENT

## 2020-02-22 NOTE — Progress Notes (Signed)
We have her on pravastatin in record, what dose of atorvastatin she is on lp

## 2020-02-22 NOTE — Progress Notes (Signed)
Cbc normal, kidney and liver tests normal, LDL cholesterol high- needs to be on DASH diet- send her copy, TSH 5.87 is she taking the 75 mcg thyroid medicine? lp

## 2020-02-23 ENCOUNTER — Other Ambulatory Visit: Payer: Self-pay

## 2020-02-23 ENCOUNTER — Ambulatory Visit: Payer: BC Managed Care – PPO

## 2020-02-23 ENCOUNTER — Other Ambulatory Visit: Payer: Self-pay | Admitting: Legal Medicine

## 2020-02-23 DIAGNOSIS — E782 Mixed hyperlipidemia: Secondary | ICD-10-CM

## 2020-02-23 LAB — TB SKIN TEST
Induration: 0 mm
TB Skin Test: NEGATIVE

## 2020-02-23 MED ORDER — PRAVASTATIN SODIUM 80 MG PO TABS: 40.0000 mg | ORAL_TABLET | Freq: Every day | ORAL | 2 refills | Status: DC

## 2020-02-23 NOTE — Progress Notes (Signed)
Skin test normal lp

## 2020-02-23 NOTE — Progress Notes (Signed)
Pravastatin 80mg  sent in lp

## 2020-03-01 DIAGNOSIS — Z20822 Contact with and (suspected) exposure to covid-19: Secondary | ICD-10-CM | POA: Diagnosis not present

## 2020-05-29 ENCOUNTER — Telehealth: Payer: Self-pay

## 2020-05-29 NOTE — Telephone Encounter (Signed)
Mother called stating her R breast has grown significantly within the past 6 months. Mother requesting mammogram for pt. States left breast is size B cup where right breast needs double D cup. Mother is concerned as pt's maternal and paternal grandparents have died from cancer. Mother states her paternal aunt died from breast cancer. Therefore pts maternal great aunt. Pts mother has not had mammogram for several years but last was normal. Pt has done self exam, did not feel lumps.   Made appointment for next Friday as pt is in college and cannot come home until then.   Any recommendations before appointment?

## 2020-05-29 NOTE — Telephone Encounter (Signed)
Fine, she does need mammogram and probably ultrasound lp

## 2020-06-08 ENCOUNTER — Ambulatory Visit: Payer: BC Managed Care – PPO | Admitting: Legal Medicine

## 2020-06-08 ENCOUNTER — Encounter: Payer: Self-pay | Admitting: Legal Medicine

## 2020-06-08 ENCOUNTER — Other Ambulatory Visit: Payer: Self-pay

## 2020-06-08 VITALS — BP 110/80 | HR 97 | Temp 98.5°F | Resp 16 | Ht 65.0 in | Wt 289.0 lb

## 2020-06-08 DIAGNOSIS — N6489 Other specified disorders of breast: Secondary | ICD-10-CM

## 2020-06-08 NOTE — Progress Notes (Signed)
Subjective:  Patient ID: Kelli Gibson, female    DOB: Feb 24, 2001  Age: 20 y.o. MRN: [id number]  Chief Complaint  Patient presents with  . Breast Problem    HPI: patient is having nonsymmetrical breasts of recent onset. Last 3 months the right breast is growing larger than left.  No nipple discharge   Current Outpatient Medications on File Prior to Visit  Medication Sig Dispense Refill  . cyclobenzaprine (FLEXERIL) 10 MG tablet Take 1 tablet (10 mg total) by mouth 3 (three) times daily as needed for muscle spasms. 30 tablet 0  . fexofenadine (ALLEGRA) 180 MG tablet Take 1 tablet (180 mg total) by mouth daily. 90 tablet 2  . ibuprofen (ADVIL) 800 MG tablet Take 1 tablet (800 mg total) by mouth every 8 (eight) hours as needed for headache. 30 tablet 6  . levothyroxine (SYNTHROID) 75 MCG tablet Take 1 tablet (75 mcg total) by mouth daily. 90 tablet 2  . medroxyPROGESTERone (PROVERA) 5 MG tablet Take 1 tablet (5 mg total) by mouth daily. 30 tablet 6  . montelukast (SINGULAIR) 10 MG tablet Take 1 tablet (10 mg total) by mouth at bedtime. 90 tablet 2  . Multiple Vitamin (MULTIVITAMIN) capsule Take by mouth.    Marland Kitchen omeprazole (PRILOSEC) 20 MG capsule Take 1 capsule (20 mg total) by mouth 2 (two) times daily before a meal. 60 capsule 6  . pravastatin (PRAVACHOL) 80 MG tablet Take 0.5 tablets (40 mg total) by mouth daily. 90 tablet 2   No current facility-administered medications on file prior to visit.   Past Medical History:  Diagnosis Date  . GERD (gastroesophageal reflux disease)   . Hyperlipemia, mixed   . Other thrombophilia (HCC)   . Secondary amenorrhea   . Thyroid disease    Past Surgical History:  Procedure Laterality Date  . TONSILECTOMY, ADENOIDECTOMY, BILATERAL MYRINGOTOMY AND TUBES  4  . TYMPANOPLASTY Left 2014    Family History  Problem Relation Age of Onset  . Hypothyroidism Mother   . Hyperlipidemia Mother   . Thrombosis Mother   . Hypertension Father    Social  History   Socioeconomic History  . Marital status: Single    Spouse name: Not on file  . Number of children: 0  . Years of education: Not on file  . Highest education level: High school graduate  Occupational History  . Occupation: kitchen at [employer]  Tobacco Use  . Smoking status: Never Smoker  . Smokeless tobacco: Never Used  Substance and Sexual Activity  . Alcohol use: Not Currently    Alcohol/week: 0.0 standard drinks  . Drug use: Never  . Sexual activity: Not Currently  Other Topics Concern  . Not on file  Social History Narrative  . Not on file   Social Determinants of Health   Financial Resource Strain: Not on file  Food Insecurity: Not on file  Transportation Needs: Not on file  Physical Activity: Not on file  Stress: Not on file  Social Connections: Not on file    Review of Systems  Constitutional: Negative for activity change and appetite change.  HENT: Negative for congestion.   Eyes: Negative for visual disturbance.  Respiratory: Negative for chest tightness and shortness of breath.   Cardiovascular: Negative for chest pain and leg swelling.  Gastrointestinal: Negative for abdominal distention and abdominal pain.  Genitourinary: Negative for difficulty urinating and dysuria.  Musculoskeletal: Negative for arthralgias and back pain.  Skin: Negative.   Neurological: Negative.  Psychiatric/Behavioral: Negative.      Objective:  BP 110/80   Pulse 97   Temp 98.5 F (36.9 C)   Resp 16   Ht 5\' 5"  (1.651 m)   Wt 289 lb (131.1 kg)   SpO2 98%   BMI 48.09 kg/m   BP/Weight 06/08/2020 02/21/2020 08/10/2019  Systolic BP 110 130 110  Diastolic BP 80 86 72  Wt. (Lbs) 289 281 256  BMI 48.09 46.76 42.6    Physical Exam Vitals reviewed.  Constitutional:      Appearance: Normal appearance. She is obese.  HENT:     Right Ear: Tympanic membrane normal.     Left Ear: Tympanic membrane normal.     Mouth/Throat:     Mouth: Mucous membranes are  moist.     Pharynx: Oropharynx is clear.  Eyes:     Extraocular Movements: Extraocular movements intact.     Conjunctiva/sclera: Conjunctivae normal.     Pupils: Pupils are equal, round, and reactive to light.  Cardiovascular:     Rate and Rhythm: Normal rate and regular rhythm.     Pulses: Normal pulses.     Heart sounds: Normal heart sounds. No murmur heard. No gallop.   Pulmonary:     Effort: Pulmonary effort is normal. No respiratory distress.     Breath sounds: No rales.  Abdominal:     General: Abdomen is flat. Bowel sounds are normal. There is no distension.     Palpations: Abdomen is soft.     Tenderness: There is no abdominal tenderness.  Musculoskeletal:     Cervical back: Normal range of motion and neck supple.  Skin:    General: Skin is warm and dry.     Capillary Refill: Capillary refill takes less than 2 seconds.  Neurological:     General: No focal deficit present.     Mental Status: She is alert and oriented to person, place, and time.       Lab Results  Component Value Date   WBC 9.3 02/21/2020   HGB 11.4 02/21/2020   HCT 36.0 02/21/2020   PLT 419 02/21/2020   GLUCOSE 84 02/21/2020   CHOL 206 (H) 02/21/2020   TRIG 107 02/21/2020   HDL 41 02/21/2020   LDLCALC 146 (H) 02/21/2020   ALT 15 02/21/2020   AST 17 02/21/2020   NA 141 02/21/2020   K 4.5 02/21/2020   CL 105 02/21/2020   CREATININE 0.77 02/21/2020   BUN 10 02/21/2020   CO2 21 02/21/2020   TSH 5.870 (H) 02/21/2020      Assessment & Plan:   Diagnoses and all orders for this visit: Breast asymmetry -     MM Digital Diagnostic Bilat -     02/23/2020 nlisted Procedure Breast Patient has asymmetry of breasts with right breast growing substantially larger than left, need workup and possible reduction mammoplasty       Follow-up: Return in about 1 month (around 07/08/2020).  An After Visit Summary was printed and given to the patient.  07/10/2020, MD [facility] [phone]

## 2020-06-11 ENCOUNTER — Other Ambulatory Visit: Payer: Self-pay | Admitting: Legal Medicine

## 2020-06-11 DIAGNOSIS — N6489 Other specified disorders of breast: Secondary | ICD-10-CM

## 2020-07-03 ENCOUNTER — Ambulatory Visit
Admission: RE | Admit: 2020-07-03 | Discharge: 2020-07-03 | Disposition: A | Discharge status: Home / Self Care | Payer: BC Managed Care – PPO | Source: Ambulatory Visit | Attending: Legal Medicine | Admitting: Legal Medicine

## 2020-07-03 ENCOUNTER — Other Ambulatory Visit: Payer: Self-pay

## 2020-07-03 DIAGNOSIS — N6489 Other specified disorders of breast: Secondary | ICD-10-CM

## 2020-07-16 ENCOUNTER — Telehealth: Payer: Self-pay

## 2020-07-16 NOTE — Telephone Encounter (Signed)
I left a message on the number(s) listed in the patients chart requesting the patient to call back regarding the upcomming appointment for 08/28/2020. The provider is out of the office that day. The appointment has been canceled. Waiting for the patient to return the call.  

## 2020-08-10 ENCOUNTER — Ambulatory Visit (INDEPENDENT_AMBULATORY_CARE_PROVIDER_SITE_OTHER): Payer: BC Managed Care – PPO | Admitting: Legal Medicine

## 2020-08-10 ENCOUNTER — Other Ambulatory Visit: Payer: Self-pay

## 2020-08-10 ENCOUNTER — Encounter: Payer: Self-pay | Admitting: Legal Medicine

## 2020-08-10 VITALS — BP 100/80 | HR 95 | Temp 98.1°F | Resp 17 | Ht 65.0 in | Wt 280.0 lb

## 2020-08-10 DIAGNOSIS — H7292 Unspecified perforation of tympanic membrane, left ear: Secondary | ICD-10-CM

## 2020-08-10 DIAGNOSIS — E782 Mixed hyperlipidemia: Secondary | ICD-10-CM

## 2020-08-10 DIAGNOSIS — R799 Abnormal finding of blood chemistry, unspecified: Secondary | ICD-10-CM | POA: Diagnosis not present

## 2020-08-10 DIAGNOSIS — N62 Hypertrophy of breast: Secondary | ICD-10-CM

## 2020-08-10 DIAGNOSIS — E039 Hypothyroidism, unspecified: Secondary | ICD-10-CM

## 2020-08-10 MED ORDER — CIPRO HC 0.2-1 % OT SUSP: 3.0000 [drp] | Freq: Two times a day (BID) | OTIC | 0 refills | Status: DC

## 2020-08-10 NOTE — Progress Notes (Signed)
Established Patient Office Visit  Subjective:  Patient ID: Kelli Gibson, female    DOB: 09/29/2000  Age: 20 y.o. MRN: [id number]  CC:  Chief Complaint  Patient presents with   Ear Pain    Patient noticed greenish and yellowish discharge for couple months.   Hearing Problem    Patient noticed for 2 days.    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  It is now draining green.  Poor hearing left side.  Past Medical History:  Diagnosis Date   GERD (gastroesophageal reflux disease)    Hyperlipemia, mixed    Other thrombophilia ([place])    Secondary amenorrhea    Thyroid disease     Past Surgical History:  Procedure Laterality Date   TONSILECTOMY, ADENOIDECTOMY, BILATERAL MYRINGOTOMY AND TUBES  4   TYMPANOPLASTY Left 2014    Family History  Problem Relation Age of Onset   Hypothyroidism Mother    Hyperlipidemia Mother    Thrombosis Mother    Hypertension Father     Social History   Socioeconomic History   Marital status: Single    Spouse name: Not on file   Number of children: 0   Years of education: Not on file   Highest education level: High school graduate  Occupational History   Occupation: kitchen at [employer]  Tobacco Use   Smoking status: Never   Smokeless tobacco: Never  Substance and Sexual Activity   Alcohol use: Not Currently    Alcohol/week: 0.0 standard drinks   Drug use: Never   Sexual activity: Not Currently  Other Topics Concern   Not on file  Social History Narrative   Not on file   Social Determinants of Health   Financial Resource Strain: Not on file  Food Insecurity: Not on file  Transportation Needs: Not on file  Physical Activity: Not on file  Stress: Not on file  Social Connections: Not on file  Intimate Partner Violence: Not on file    Outpatient Medications Prior to Visit  Medication Sig Dispense Refill   cyclobenzaprine (FLEXERIL) 10 MG tablet Take 1  tablet (10 mg total) by mouth 3 (three) times daily as needed for muscle spasms. 30 tablet 0   fexofenadine (ALLEGRA) 180 MG tablet Take 1 tablet (180 mg total) by mouth daily. 90 tablet 2   ibuprofen (ADVIL) 800 MG tablet Take 1 tablet (800 mg total) by mouth every 8 (eight) hours as needed for headache. 30 tablet 6   levothyroxine (SYNTHROID) 75 MCG tablet Take 1 tablet (75 mcg total) by mouth daily. 90 tablet 2   loratadine (CLARITIN) 10 MG tablet Take by mouth.     medroxyPROGESTERone (PROVERA) 5 MG tablet Take 1 tablet (5 mg total) by mouth daily. 30 tablet 6   montelukast (SINGULAIR) 10 MG tablet Take 1 tablet (10 mg total) by mouth at bedtime. 90 tablet 2   Multiple Vitamin (MULTIVITAMIN) capsule Take by mouth.     omeprazole (PRILOSEC) 20 MG capsule Take 1 capsule (20 mg total) by mouth 2 (two) times daily before a meal. 60 capsule 6   pravastatin (PRAVACHOL) 80 MG tablet Take 0.5 tablets (40 mg total) by mouth daily. 90 tablet 2   No facility-administered medications prior to visit.    Allergies  Allergen Reactions   Other Other (See Comments)    Tree nuts    ROS Review of Systems  Constitutional:  Negative for  activity change and appetite change.  HENT:  Positive for ear pain. Negative for congestion.   Eyes:  Negative for visual disturbance.  Respiratory:  Negative for chest tightness and shortness of breath.   Cardiovascular:  Negative for chest pain and palpitations.  Gastrointestinal:  Negative for abdominal distention and abdominal pain.  Endocrine: Negative for polyuria.  Genitourinary:  Negative for difficulty urinating and dyspareunia.  Musculoskeletal:  Negative for arthralgias and back pain.  Psychiatric/Behavioral: Negative.       Objective:    Physical Exam Vitals reviewed.  Constitutional:      Appearance: Normal appearance.  HENT:     Head: Normocephalic.     Right Ear: Tympanic membrane normal.     Left Ear: Decreased hearing noted. Tympanic  membrane is perforated.     Ears:      Comments: PERFORATION LEFT WAR, LARGE HOLE    Mouth/Throat:     Mouth: Mucous membranes are moist.     Pharynx: Oropharynx is clear.  Eyes:     Extraocular Movements: Extraocular movements intact.     Conjunctiva/sclera: Conjunctivae normal.     Pupils: Pupils are equal, round, and reactive to light.  Cardiovascular:     Rate and Rhythm: Normal rate and regular rhythm.     Pulses: Normal pulses.     Heart sounds: Normal heart sounds. No murmur heard.   No gallop.  Abdominal:     General: Abdomen is flat. Bowel sounds are normal. There is no distension.     Palpations: Abdomen is soft.     Tenderness: There is no abdominal tenderness.  Musculoskeletal:        General: Normal range of motion.     Cervical back: Normal range of motion and neck supple.  Skin:    General: Skin is warm.     Capillary Refill: Capillary refill takes less than 2 seconds.  Neurological:     General: No focal deficit present.     Mental Status: She is alert and oriented to person, place, and time. Mental status is at baseline.    BP 100/80   Pulse 95   Temp 98.1 F (36.7 C)   Resp 17   Ht _0  (1.651 m)   Wt 280 lb (127 kg)   SpO2 98%   BMI 46.59 kg/m  Wt Readings from Last 3 Encounters:  08/10/20 280 lb (127 kg)  06/08/20 289 lb (131.1 kg)  02/21/20 281 lb (127.5 kg)     Health Maintenance Due  Topic Date Due   COVID-19 Vaccine (1) Never done   HPV VACCINES (1 - 2-dose series) Never done   HIV Screening  Never done   Hepatitis C Screening  Never done       Topic Date Due   HPV VACCINES (1 - 2-dose series) Never done    Lab Results  Component Value Date   TSH 2.560 08/10/2020   Lab Results  Component Value Date   WBC 8.4 08/10/2020   HGB 12.6 08/10/2020   HCT 39.3 08/10/2020   MCV 76 (L) 08/10/2020   PLT 431 08/10/2020   Lab Results  Component Value Date   NA 140 08/10/2020   K 4.6 08/10/2020   CO2 21 08/10/2020   GLUCOSE 78  08/10/2020   BUN 12 08/10/2020   CREATININE 0.72 08/10/2020   BILITOT 0.2 08/10/2020   ALKPHOS 89 08/10/2020   AST 12 08/10/2020   ALT 12 08/10/2020   PROT 7.1 08/10/2020  ALBUMIN 4.6 08/10/2020   CALCIUM 9.6 08/10/2020   EGFR 123 08/10/2020   Lab Results  Component Value Date   CHOL 153 08/10/2020   Lab Results  Component Value Date   HDL 37 (L) 08/10/2020   Lab Results  Component Value Date   LDLCALC 93 08/10/2020   Lab Results  Component Value Date   TRIG 129 08/10/2020   Lab Results  Component Value Date   CHOLHDL 4.1 08/10/2020   No results found for: HGBA1C    Assessment & Plan:  Diagnoses and all orders for this visit: Perforation of left tympanic membrane -     ciprofloxacin-hydrocortisone (CIPRO HC) OTIC suspension; Place 3 drops into the left ear 2 (two) times daily. Patient has a large left TM membrane hole- needs repair  Gynecomastia, female -     Ambulatory referral to Obstetrics / Gynecology -     CBC with Differential/Platelet -     Comprehensive metabolic panel We reviewed her mammograms, she will see GYN  Adult hypothyroidism -     CBC with Differential/Platelet -     Comprehensive metabolic panel -     TSH Patient is known to have hypothyroidism and is n treatment with levothyroxine 61mg.  Patient was diagnosed 10 years ago.  Other treatment includes none.  Patient is compliant with medicines and last TSH 6 months ago.  Last TSH was normal.   Mixed hyperlipidemia -     Lipid panel AN INDIVIDUAL CARE PLAN for hyperlipidemia/ cholesterol was established and reinforced today.  The patient's status was assessed using clinical findings on exam, lab and other diagnostic tests. The patient's disease status was assessed based on evidence-based guidelines and found to be fair controlled. MEDICATIONS were reviewed. SELF MANAGEMENT GOALS have been discussed and patient's success at attaining the goal of low cholesterol was assessed. RECOMMENDATION  given include regular exercise 3 days a week and low cholesterol/low fat diet. CLINICAL SUMMARY including written plan to identify barriers unique to the patient due to social or economic  reasons was discussed.  Other orders -     Cardiovascular Risk Assessment      Follow-up: Return if symptoms worsen or fail to improve.    LReinaldo Meeker MD

## 2020-08-11 LAB — CARDIOVASCULAR RISK ASSESSMENT

## 2020-08-11 LAB — CBC WITH DIFFERENTIAL/PLATELET
Basophils Absolute: 0 10*3/uL (ref 0.0–0.2)
Basos: 1 %
EOS (ABSOLUTE): 0.2 10*3/uL (ref 0.0–0.4)
Eos: 2 %
Hematocrit: 39.3 % (ref 34.0–46.6)
Hemoglobin: 12.6 g/dL (ref 11.1–15.9)
Immature Grans (Abs): 0 10*3/uL (ref 0.0–0.1)
Immature Granulocytes: 0 %
Lymphocytes Absolute: 2.5 10*3/uL (ref 0.7–3.1)
Lymphs: 30 %
MCH: 24.5 pg — ABNORMAL LOW (ref 26.6–33.0)
MCHC: 32.1 g/dL (ref 31.5–35.7)
MCV: 76 fL — ABNORMAL LOW (ref 79–97)
Monocytes Absolute: 0.6 10*3/uL (ref 0.1–0.9)
Monocytes: 7 %
Neutrophils Absolute: 5 10*3/uL (ref 1.4–7.0)
Neutrophils: 60 %
Platelets: 431 10*3/uL (ref 150–450)
RBC: 5.15 x10E6/uL (ref 3.77–5.28)
RDW: 19.8 % — ABNORMAL HIGH (ref 11.7–15.4)
WBC: 8.4 10*3/uL (ref 3.4–10.8)

## 2020-08-11 LAB — LIPID PANEL
Chol/HDL Ratio: 4.1 ratio (ref 0.0–4.4)
Cholesterol, Total: 153 mg/dL (ref 100–199)
HDL: 37 mg/dL — ABNORMAL LOW (ref >39)
LDL Chol Calc (NIH): 93 mg/dL (ref 0–99)
Triglycerides: 129 mg/dL (ref 0–149)
VLDL Cholesterol Cal: 23 mg/dL (ref 5–40)

## 2020-08-11 LAB — TSH: TSH: 2.56 u[IU]/mL (ref 0.450–4.500)

## 2020-08-11 LAB — COMPREHENSIVE METABOLIC PANEL
ALT: 12 IU/L (ref 0–32)
AST: 12 IU/L (ref 0–40)
Albumin/Globulin Ratio: 1.8 (ref 1.2–2.2)
Albumin: 4.6 g/dL (ref 3.9–5.0)
Alkaline Phosphatase: 89 IU/L (ref 42–106)
BUN/Creatinine Ratio: 17 (ref 9–23)
BUN: 12 mg/dL (ref 6–20)
Bilirubin Total: 0.2 mg/dL (ref 0.0–1.2)
CO2: 21 mmol/L (ref 20–29)
Calcium: 9.6 mg/dL (ref 8.7–10.2)
Chloride: 104 mmol/L (ref 96–106)
Creatinine, Ser: 0.72 mg/dL (ref 0.57–1.00)
Globulin, Total: 2.5 g/dL (ref 1.5–4.5)
Glucose: 78 mg/dL (ref 65–99)
Potassium: 4.6 mmol/L (ref 3.5–5.2)
Sodium: 140 mmol/L (ref 134–144)
Total Protein: 7.1 g/dL (ref 6.0–8.5)
eGFR: 123 mL/min/{1.73_m2} (ref >59)

## 2020-08-12 NOTE — Progress Notes (Signed)
Cholesterol normal, CBC small RBCs- check ferritin, Kidney and liver tests normal, TSH 2.56 normal lp

## 2020-08-14 ENCOUNTER — Other Ambulatory Visit: Payer: Self-pay

## 2020-08-14 ENCOUNTER — Telehealth: Payer: Self-pay

## 2020-08-14 DIAGNOSIS — H7292 Unspecified perforation of tympanic membrane, left ear: Secondary | ICD-10-CM

## 2020-08-14 MED ORDER — CIPROFLOXACIN-DEXAMETHASONE 0.3-0.1 % OT SUSP: 4.0000 [drp] | Freq: Two times a day (BID) | OTIC | 0 refills | Status: AC

## 2020-08-14 NOTE — Telephone Encounter (Signed)
Fine lp

## 2020-08-14 NOTE — Telephone Encounter (Signed)
[facility] on [address] is requesting Cipro ear drops be changed to Cipro- Dex due to expense. Pt's insurance does not cover original cipro. Please send to [facility].   Lorita Officer, CCMA 08/14/20 2:30 PM

## 2020-08-14 NOTE — Telephone Encounter (Signed)
SentLorita Officer, CCMA 08/14/20 3:30 PM

## 2020-08-22 LAB — SPECIMEN STATUS REPORT

## 2020-08-22 LAB — FERRITIN: Ferritin: 18 ng/mL (ref 15–150)

## 2020-08-22 NOTE — Progress Notes (Signed)
Ferritin 18 low normal, start iron pills one a day lp

## 2020-08-28 ENCOUNTER — Ambulatory Visit: Payer: BC Managed Care – PPO | Admitting: Legal Medicine

## 2020-08-30 DIAGNOSIS — Z9889 Other specified postprocedural states: Secondary | ICD-10-CM | POA: Diagnosis not present

## 2020-09-13 ENCOUNTER — Other Ambulatory Visit: Payer: Self-pay | Admitting: Legal Medicine

## 2020-09-13 DIAGNOSIS — N978 Female infertility of other origin: Secondary | ICD-10-CM

## 2020-10-04 DIAGNOSIS — N6489 Other specified disorders of breast: Secondary | ICD-10-CM | POA: Diagnosis not present

## 2020-10-28 ENCOUNTER — Other Ambulatory Visit: Payer: Self-pay | Admitting: Legal Medicine

## 2020-10-28 DIAGNOSIS — E039 Hypothyroidism, unspecified: Secondary | ICD-10-CM

## 2020-11-07 ENCOUNTER — Other Ambulatory Visit: Payer: Self-pay | Admitting: Legal Medicine

## 2020-11-07 DIAGNOSIS — E782 Mixed hyperlipidemia: Secondary | ICD-10-CM

## 2021-01-28 ENCOUNTER — Other Ambulatory Visit: Payer: Self-pay | Admitting: Legal Medicine

## 2021-03-01 ENCOUNTER — Other Ambulatory Visit: Payer: Self-pay | Admitting: Legal Medicine

## 2021-03-01 DIAGNOSIS — J301 Allergic rhinitis due to pollen: Secondary | ICD-10-CM

## 2021-05-01 ENCOUNTER — Other Ambulatory Visit: Payer: Self-pay | Admitting: Legal Medicine

## 2021-05-01 DIAGNOSIS — E782 Mixed hyperlipidemia: Secondary | ICD-10-CM

## 2021-05-01 NOTE — Telephone Encounter (Signed)
Refill sent to pharmacy.   

## 2021-05-17 ENCOUNTER — Other Ambulatory Visit: Payer: Self-pay | Admitting: Legal Medicine

## 2021-05-17 DIAGNOSIS — E039 Hypothyroidism, unspecified: Secondary | ICD-10-CM

## 2021-07-12 ENCOUNTER — Other Ambulatory Visit: Payer: Self-pay | Admitting: Legal Medicine

## 2021-07-12 DIAGNOSIS — N978 Female infertility of other origin: Secondary | ICD-10-CM

## 2021-07-12 NOTE — Telephone Encounter (Signed)
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 Please advice.

## 2021-08-13 IMAGING — US US BREAST*R* LIMITED INC AXILLA
1 series · 12 of 12 positions shown · non-contrast
Comparison: None.

CLINICAL DATA: 19-year-old female with asymmetric right breast
enlargement presenting for bilateral breast ultrasound.

EXAM:
ULTRASOUND OF THE BILATERAL BREAST

[Series 1: us breast*right* limited inc axilla · 0.10mm/px · 12 of 12 slices shown]
[im 1/12]
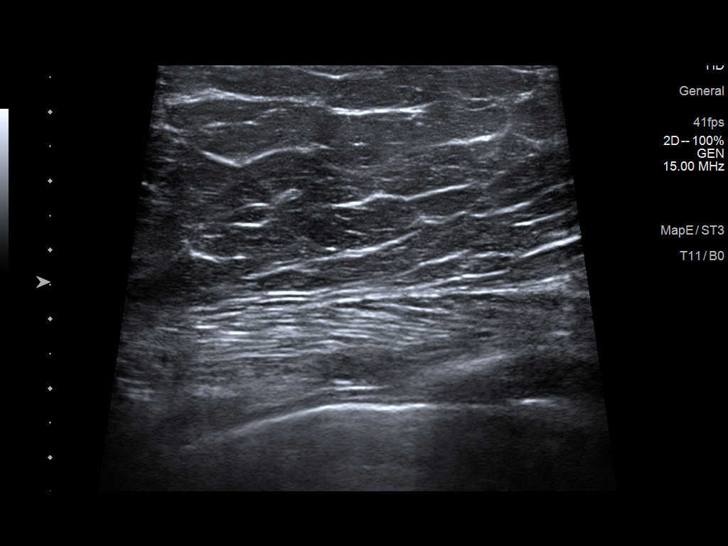
[im 2/12]
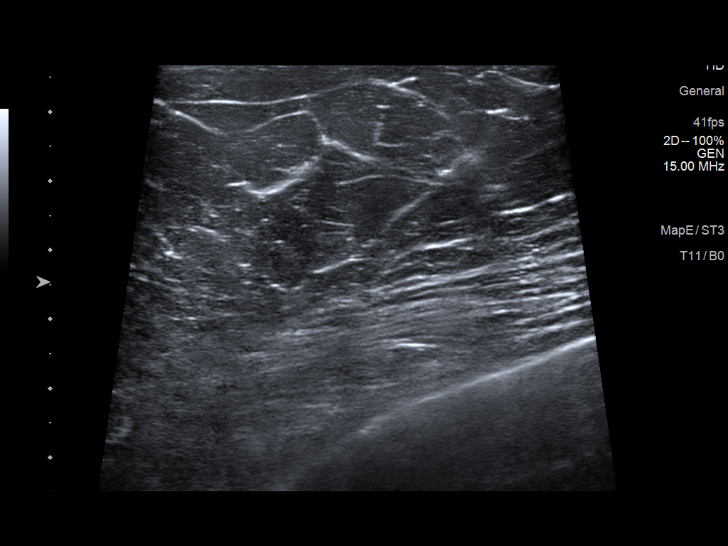
[im 3/12]
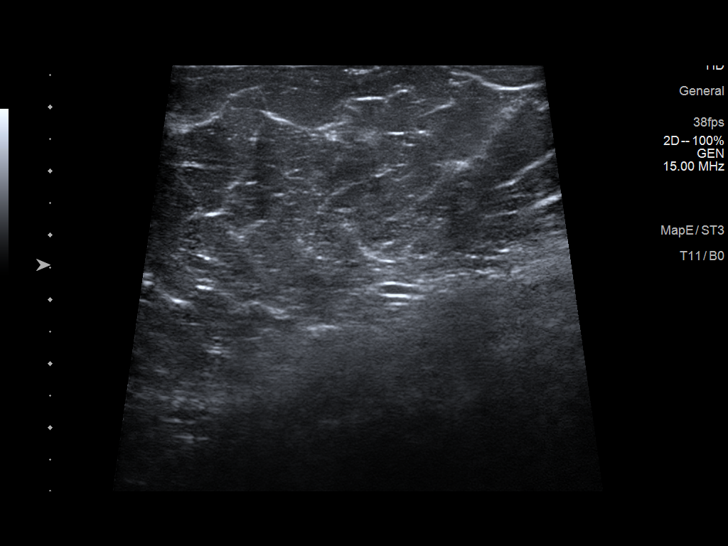
[im 4/12]
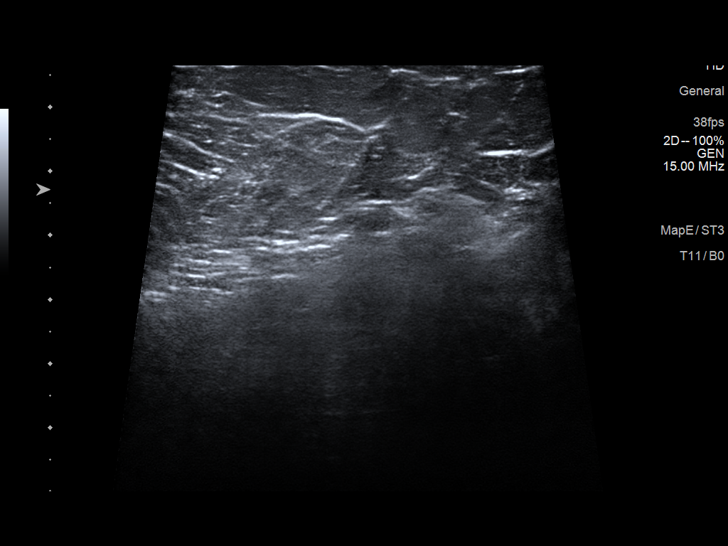
[im 5/12]
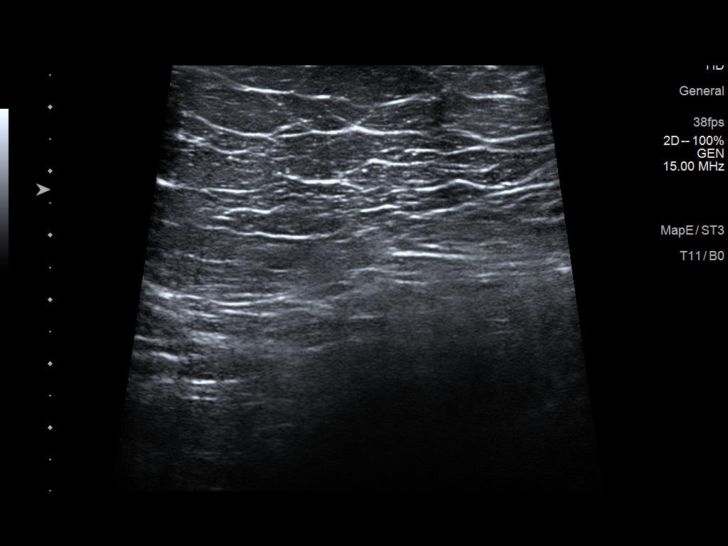
[im 6/12]
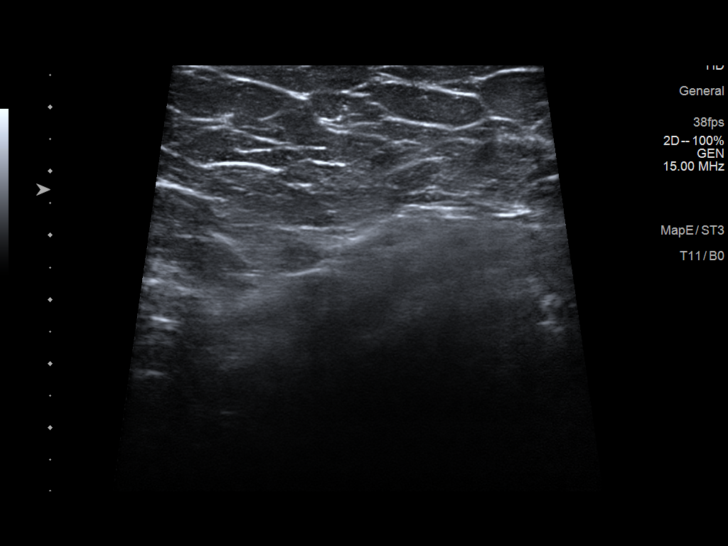
[im 7/12]
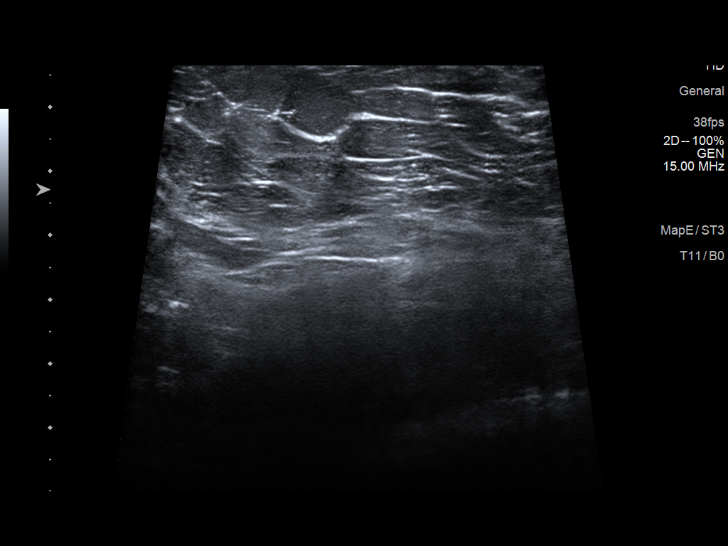
[im 8/12]
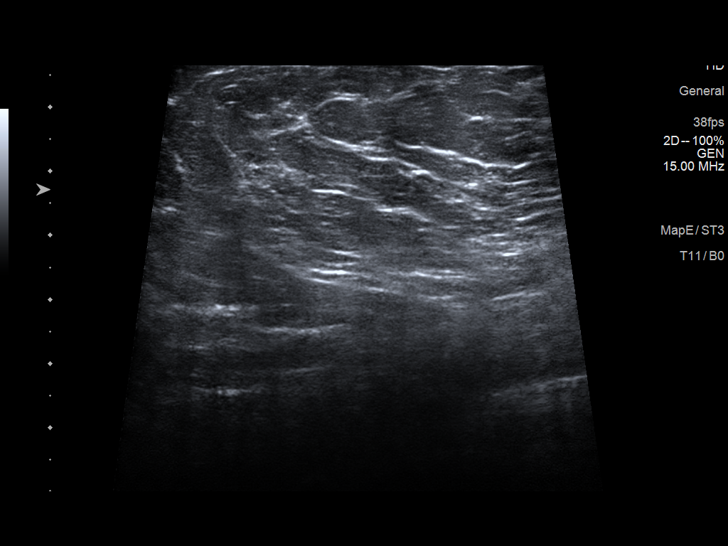
[im 9/12]
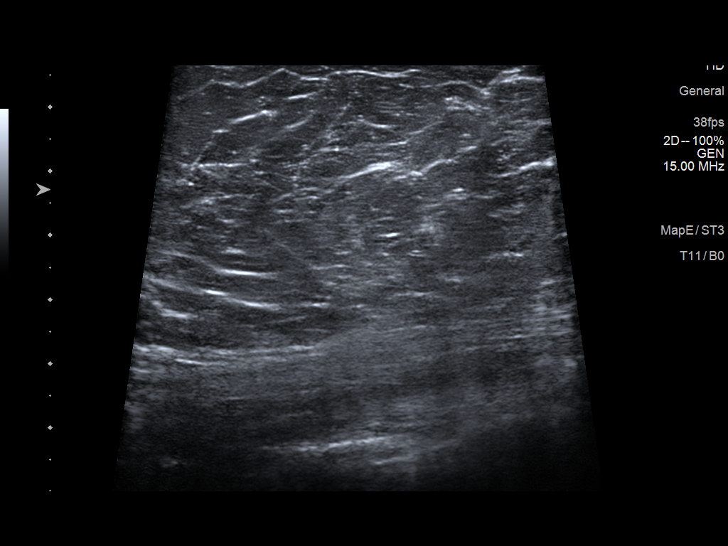
[im 10/12]
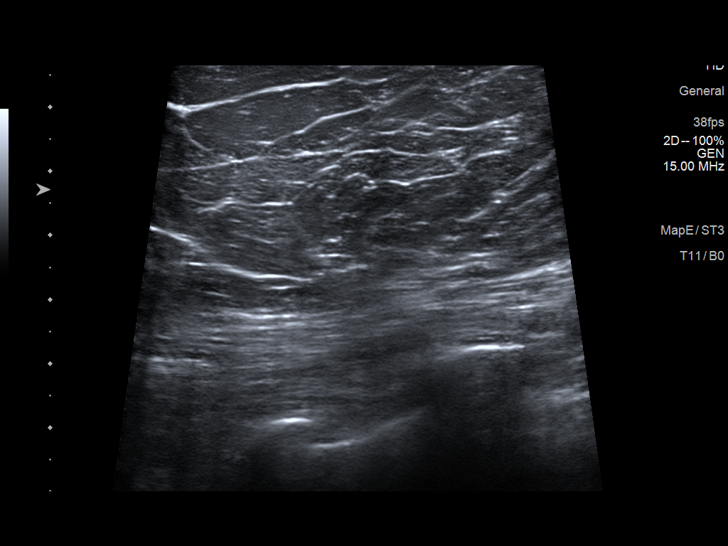
[im 11/12]
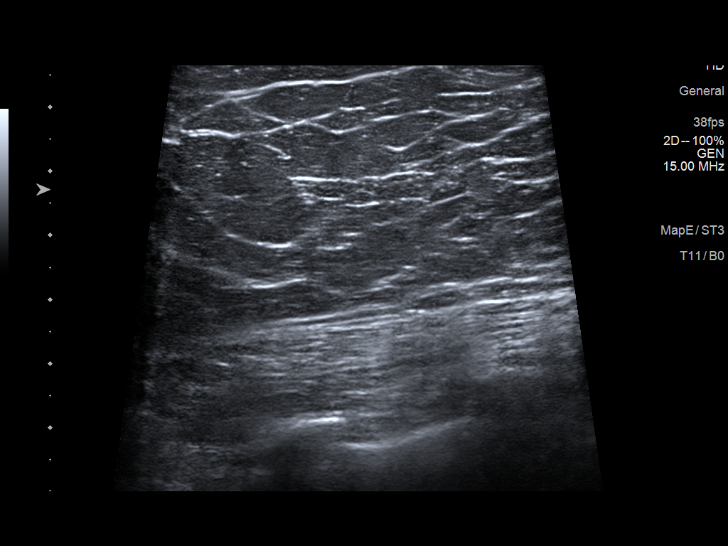
[im 12/12]
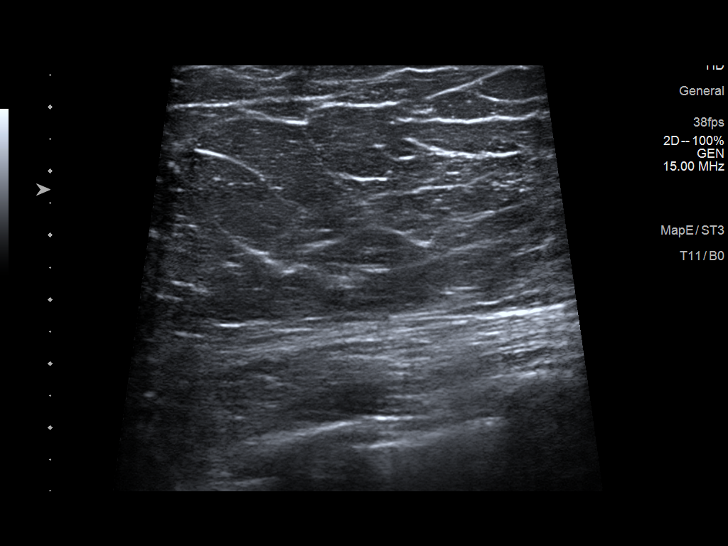

[12 of 12 positions shown; findings below may reference images not displayed]

FINDINGS: Ultrasound of the entire right breast demonstrates normal
fibroglandular tissue. No masses or suspicious areas of shadowing
are identified.

Ultrasound of the entire left breast demonstrates normal
fibroglandular tissue. No masses or suspicious areas of shadowing
are identified.
IMPRESSION: No sonographic abnormalities in either breast on complete bilateral
breast ultrasound.

RECOMMENDATION:
Clinical follow-up.

I have discussed the findings and recommendations with the patient.
If applicable, a reminder letter will be sent to the patient
regarding the next appointment.

BI-RADS CATEGORY  1: Negative.

## 2021-08-13 IMAGING — US US BREAST*L* LIMITED INC AXILLA
1 series · 12 of 12 positions shown · non-contrast
Comparison: None.

CLINICAL DATA: 19-year-old female with asymmetric right breast
enlargement presenting for bilateral breast ultrasound.

EXAM:
ULTRASOUND OF THE BILATERAL BREAST

[Series 1: us breast*left* limited inc axilla · 0.09mm/px · 12 of 12 slices shown]
[im 1/12]
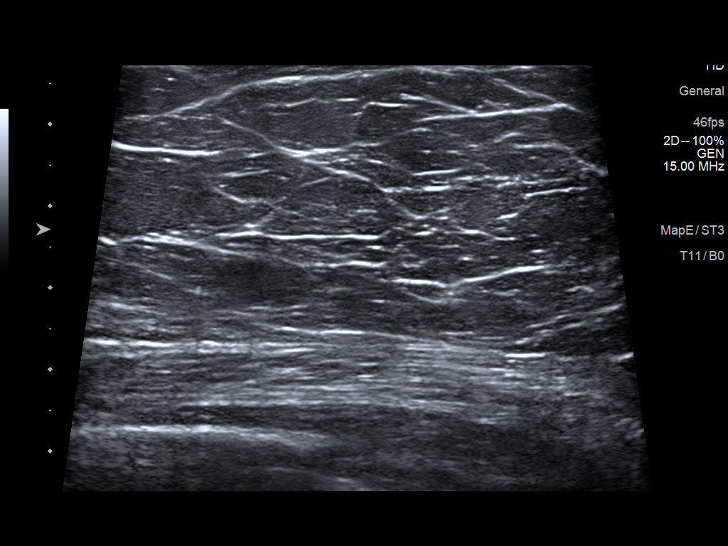
[im 2/12]
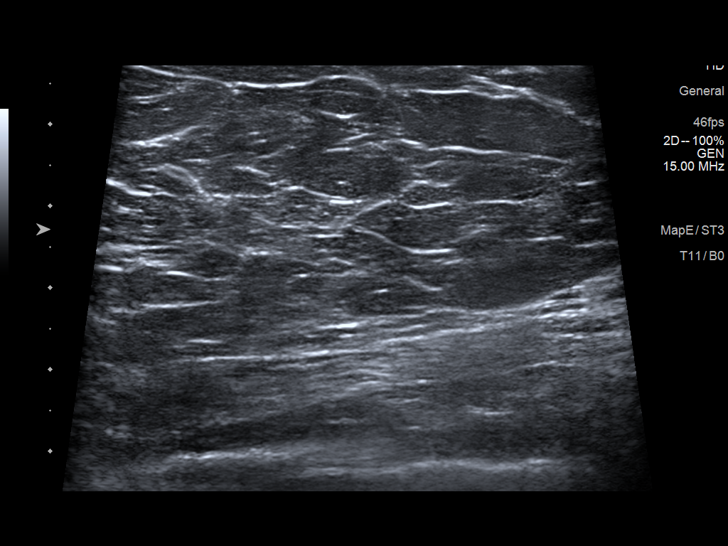
[im 3/12]
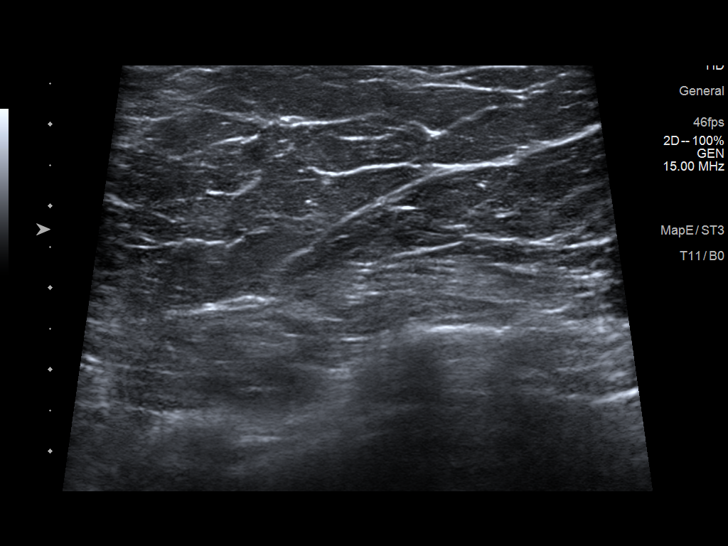
[im 4/12]
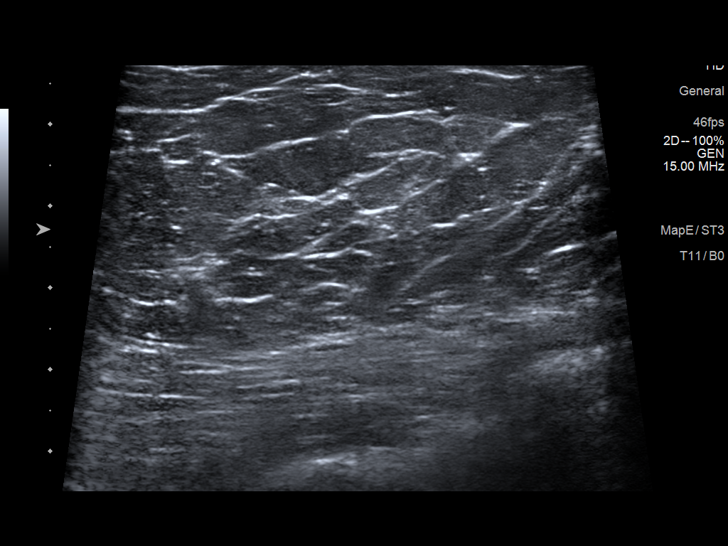
[im 5/12]
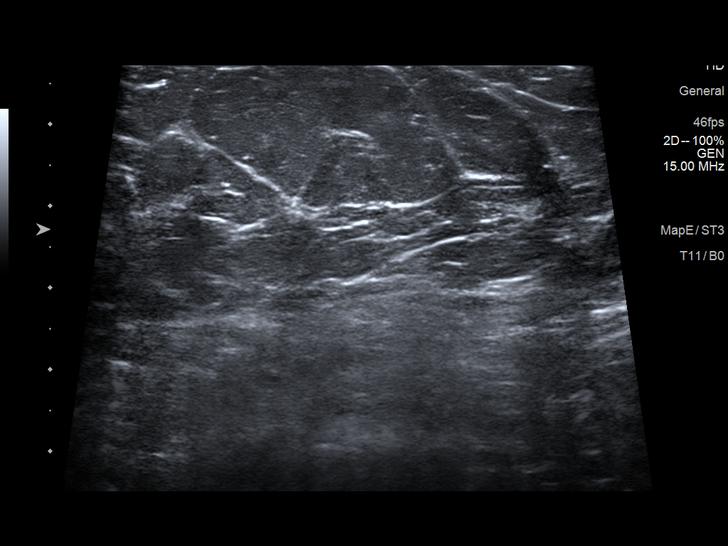
[im 6/12]
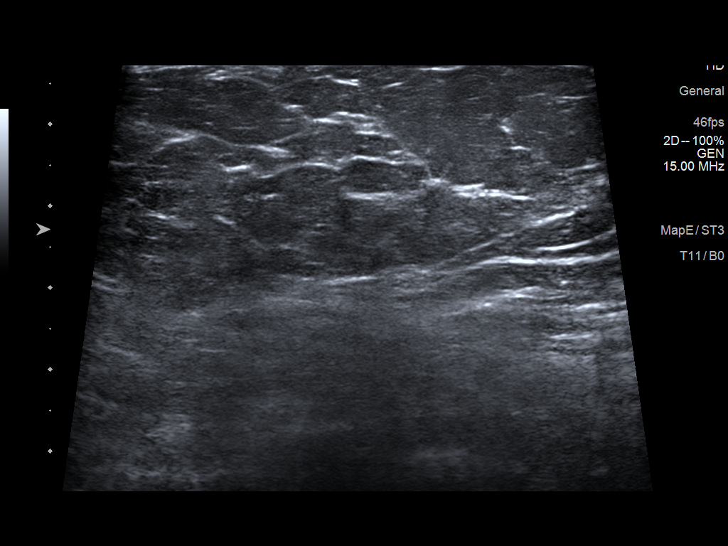
[im 7/12]
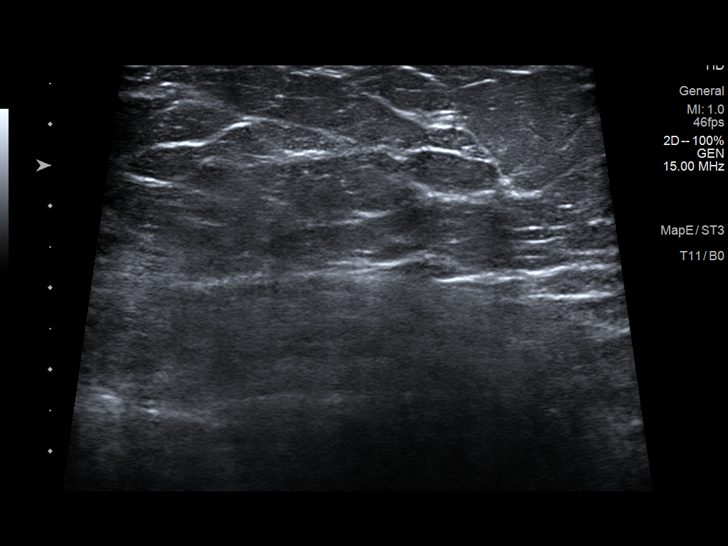
[im 8/12]
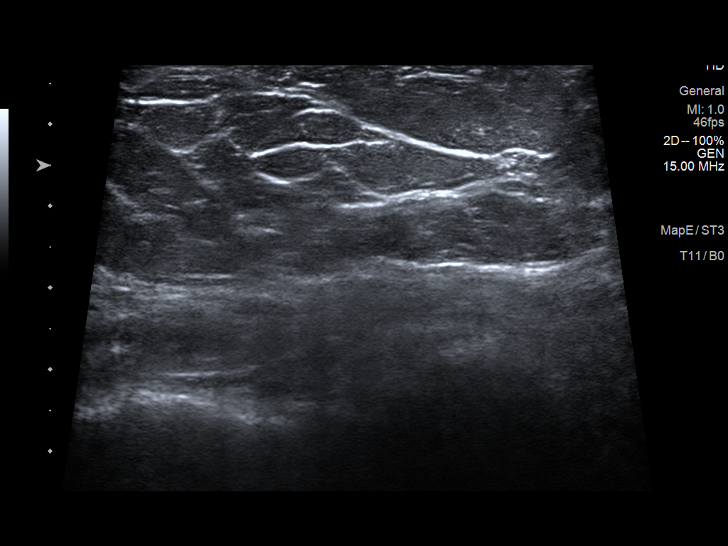
[im 9/12]
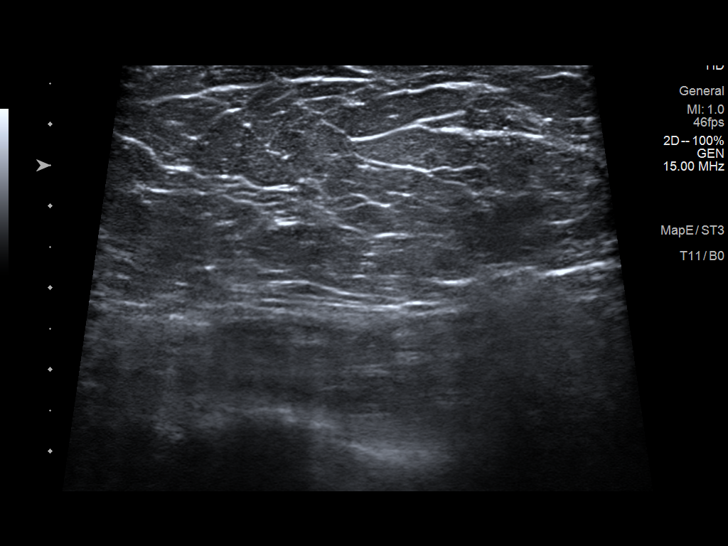
[im 10/12]
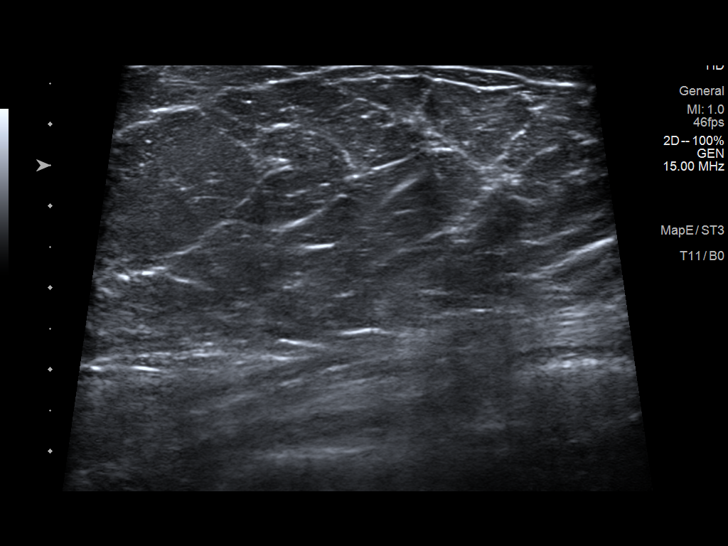
[im 11/12]
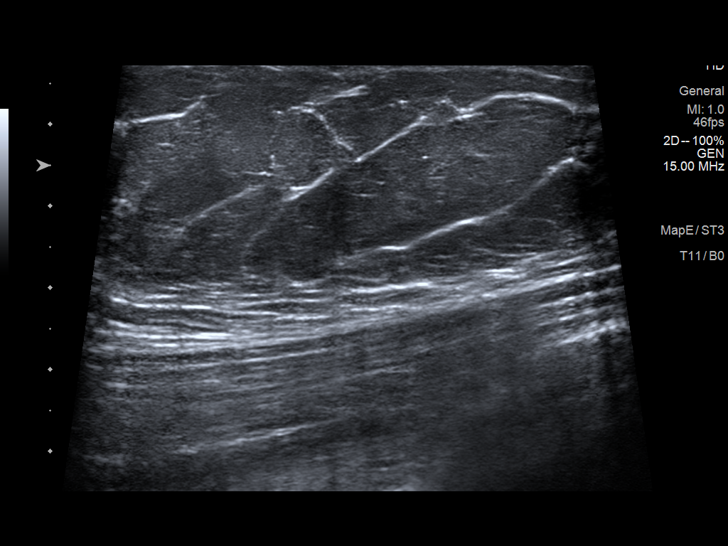
[im 12/12]
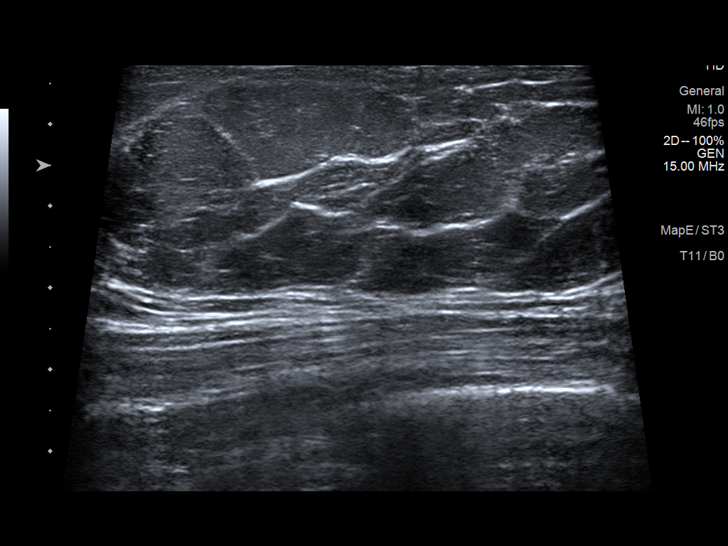

[12 of 12 positions shown; findings below may reference images not displayed]

FINDINGS: Ultrasound of the entire right breast demonstrates normal
fibroglandular tissue. No masses or suspicious areas of shadowing
are identified.

Ultrasound of the entire left breast demonstrates normal
fibroglandular tissue. No masses or suspicious areas of shadowing
are identified.
IMPRESSION: No sonographic abnormalities in either breast on complete bilateral
breast ultrasound.

RECOMMENDATION:
Clinical follow-up.

I have discussed the findings and recommendations with the patient.
If applicable, a reminder letter will be sent to the patient
regarding the next appointment.

BI-RADS CATEGORY  1: Negative.

## 2021-11-11 DIAGNOSIS — Z111 Encounter for screening for respiratory tuberculosis: Secondary | ICD-10-CM | POA: Diagnosis not present

## 2021-11-12 DIAGNOSIS — R42 Dizziness and giddiness: Secondary | ICD-10-CM | POA: Diagnosis not present

## 2021-11-12 DIAGNOSIS — E039 Hypothyroidism, unspecified: Secondary | ICD-10-CM | POA: Diagnosis not present

## 2021-11-13 DIAGNOSIS — Z23 Encounter for immunization: Secondary | ICD-10-CM | POA: Diagnosis not present

## 2021-11-18 ENCOUNTER — Other Ambulatory Visit: Payer: Self-pay | Admitting: Legal Medicine

## 2021-11-22 ENCOUNTER — Other Ambulatory Visit: Payer: Self-pay | Admitting: Legal Medicine

## 2021-11-22 DIAGNOSIS — E039 Hypothyroidism, unspecified: Secondary | ICD-10-CM

## 2022-01-24 DIAGNOSIS — H6691 Otitis media, unspecified, right ear: Secondary | ICD-10-CM | POA: Diagnosis not present

## 2022-01-24 DIAGNOSIS — J09X9 Influenza due to identified novel influenza A virus with other manifestations: Secondary | ICD-10-CM | POA: Diagnosis not present

## 2022-01-24 DIAGNOSIS — H608X1 Other otitis externa, right ear: Secondary | ICD-10-CM | POA: Diagnosis not present

## 2022-02-20 ENCOUNTER — Other Ambulatory Visit: Payer: Self-pay | Admitting: Legal Medicine

## 2022-04-18 ENCOUNTER — Other Ambulatory Visit: Payer: Self-pay | Admitting: Family Medicine

## 2022-04-18 DIAGNOSIS — N978 Female infertility of other origin: Secondary | ICD-10-CM

## 2022-05-13 DIAGNOSIS — H6692 Otitis media, unspecified, left ear: Secondary | ICD-10-CM | POA: Diagnosis not present

## 2022-09-16 ENCOUNTER — Encounter: Payer: BC Managed Care – PPO | Admitting: Physician Assistant

## 2022-09-23 ENCOUNTER — Other Ambulatory Visit: Payer: Self-pay

## 2022-09-23 DIAGNOSIS — E782 Mixed hyperlipidemia: Secondary | ICD-10-CM

## 2022-09-23 MED ORDER — PRAVASTATIN SODIUM 40 MG PO TABS: 40.0000 mg | ORAL_TABLET | Freq: Every day | ORAL | 1 refills | Status: AC

## 2023-09-29 ENCOUNTER — Other Ambulatory Visit (HOSPITAL_BASED_OUTPATIENT_CLINIC_OR_DEPARTMENT_OTHER): Payer: Self-pay | Admitting: Physician Assistant

## 2023-09-29 DIAGNOSIS — N920 Excessive and frequent menstruation with regular cycle: Secondary | ICD-10-CM

## 2023-09-30 ENCOUNTER — Ambulatory Visit (INDEPENDENT_AMBULATORY_CARE_PROVIDER_SITE_OTHER)
Admission: RE | Admit: 2023-09-30 | Discharge: 2023-09-30 | Disposition: A | Discharge status: Home / Self Care | Source: Ambulatory Visit | Attending: Physician Assistant | Admitting: Physician Assistant

## 2023-09-30 DIAGNOSIS — N920 Excessive and frequent menstruation with regular cycle: Secondary | ICD-10-CM

## 2023-11-11 ENCOUNTER — Other Ambulatory Visit (HOSPITAL_BASED_OUTPATIENT_CLINIC_OR_DEPARTMENT_OTHER): Payer: Self-pay | Admitting: Physician Assistant

## 2023-11-11 DIAGNOSIS — R4020 Unspecified coma: Secondary | ICD-10-CM

## 2023-11-11 DIAGNOSIS — R519 Headache, unspecified: Secondary | ICD-10-CM

## 2023-11-26 ENCOUNTER — Ambulatory Visit (INDEPENDENT_AMBULATORY_CARE_PROVIDER_SITE_OTHER)
Admission: RE | Admit: 2023-11-26 | Discharge: 2023-11-26 | Disposition: A | Discharge status: Home / Self Care | Source: Ambulatory Visit | Attending: Physician Assistant | Admitting: Physician Assistant

## 2023-11-26 DIAGNOSIS — R55 Syncope and collapse: Secondary | ICD-10-CM

## 2023-11-26 DIAGNOSIS — R519 Headache, unspecified: Secondary | ICD-10-CM | POA: Diagnosis not present

## 2023-11-26 DIAGNOSIS — R4020 Unspecified coma: Secondary | ICD-10-CM

## 2023-11-26 MED ORDER — GADOBUTROL 1 MMOL/ML IV SOLN
10.0000 mL | Freq: Once | INTRAVENOUS | Status: AC | PRN
  Administered 2023-11-26: 10 mL via INTRAVENOUS
